# Patient Record
Sex: Male | Born: 1945 | Race: White | Hispanic: No | Marital: Married | Smoking: Never smoker
Health system: Southern US, Community
[De-identification: ages and names within clinical notes are randomized; demographics above are authoritative.]

## PROBLEM LIST (undated history)

## (undated) DIAGNOSIS — R42 Dizziness and giddiness: Secondary | ICD-10-CM

## (undated) DIAGNOSIS — R079 Chest pain, unspecified: Secondary | ICD-10-CM

## (undated) HISTORY — DX: Chest pain, unspecified: R07.9

## (undated) HISTORY — DX: Dizziness and giddiness: R42

---

## 2012-05-25 DIAGNOSIS — H35379 Puckering of macula, unspecified eye: Secondary | ICD-10-CM | POA: Diagnosis not present

## 2012-05-25 DIAGNOSIS — H524 Presbyopia: Secondary | ICD-10-CM | POA: Diagnosis not present

## 2012-05-25 DIAGNOSIS — H2510 Age-related nuclear cataract, unspecified eye: Secondary | ICD-10-CM | POA: Diagnosis not present

## 2013-04-21 ENCOUNTER — Other Ambulatory Visit: Payer: Self-pay | Admitting: Gastroenterology

## 2013-04-21 ENCOUNTER — Ambulatory Visit
Admission: RE | Admit: 2013-04-21 | Discharge: 2013-04-21 | Disposition: A | Discharge status: Home / Self Care | Payer: Medicare PPO | Source: Ambulatory Visit | Attending: Gastroenterology | Admitting: Gastroenterology

## 2013-04-21 DIAGNOSIS — R05 Cough: Secondary | ICD-10-CM

## 2013-04-21 DIAGNOSIS — R059 Cough, unspecified: Secondary | ICD-10-CM

## 2013-07-07 DIAGNOSIS — E785 Hyperlipidemia, unspecified: Secondary | ICD-10-CM | POA: Diagnosis not present

## 2013-07-13 DIAGNOSIS — E785 Hyperlipidemia, unspecified: Secondary | ICD-10-CM | POA: Diagnosis not present

## 2013-08-24 DIAGNOSIS — H43819 Vitreous degeneration, unspecified eye: Secondary | ICD-10-CM | POA: Diagnosis not present

## 2013-08-24 DIAGNOSIS — H2510 Age-related nuclear cataract, unspecified eye: Secondary | ICD-10-CM | POA: Diagnosis not present

## 2013-08-24 DIAGNOSIS — H35379 Puckering of macula, unspecified eye: Secondary | ICD-10-CM | POA: Diagnosis not present

## 2013-08-24 DIAGNOSIS — H25019 Cortical age-related cataract, unspecified eye: Secondary | ICD-10-CM | POA: Diagnosis not present

## 2013-09-29 DIAGNOSIS — H2589 Other age-related cataract: Secondary | ICD-10-CM | POA: Diagnosis not present

## 2013-09-29 DIAGNOSIS — H35379 Puckering of macula, unspecified eye: Secondary | ICD-10-CM | POA: Diagnosis not present

## 2013-09-29 DIAGNOSIS — H2510 Age-related nuclear cataract, unspecified eye: Secondary | ICD-10-CM | POA: Diagnosis not present

## 2013-09-29 DIAGNOSIS — H25019 Cortical age-related cataract, unspecified eye: Secondary | ICD-10-CM | POA: Diagnosis not present

## 2013-10-13 DIAGNOSIS — H2589 Other age-related cataract: Secondary | ICD-10-CM | POA: Diagnosis not present

## 2013-10-13 DIAGNOSIS — H2510 Age-related nuclear cataract, unspecified eye: Secondary | ICD-10-CM | POA: Diagnosis not present

## 2013-10-13 DIAGNOSIS — H25019 Cortical age-related cataract, unspecified eye: Secondary | ICD-10-CM | POA: Diagnosis not present

## 2013-10-14 DIAGNOSIS — L219 Seborrheic dermatitis, unspecified: Secondary | ICD-10-CM | POA: Diagnosis not present

## 2013-10-14 DIAGNOSIS — L919 Hypertrophic disorder of the skin, unspecified: Secondary | ICD-10-CM | POA: Diagnosis not present

## 2013-10-14 DIAGNOSIS — L821 Other seborrheic keratosis: Secondary | ICD-10-CM | POA: Diagnosis not present

## 2013-10-14 DIAGNOSIS — D2320 Other benign neoplasm of skin of unspecified ear and external auricular canal: Secondary | ICD-10-CM | POA: Diagnosis not present

## 2013-10-14 DIAGNOSIS — L738 Other specified follicular disorders: Secondary | ICD-10-CM | POA: Diagnosis not present

## 2013-10-14 DIAGNOSIS — L570 Actinic keratosis: Secondary | ICD-10-CM | POA: Diagnosis not present

## 2013-10-14 DIAGNOSIS — D485 Neoplasm of uncertain behavior of skin: Secondary | ICD-10-CM | POA: Diagnosis not present

## 2013-10-14 DIAGNOSIS — L909 Atrophic disorder of skin, unspecified: Secondary | ICD-10-CM | POA: Diagnosis not present

## 2013-10-14 DIAGNOSIS — D239 Other benign neoplasm of skin, unspecified: Secondary | ICD-10-CM | POA: Diagnosis not present

## 2013-10-14 DIAGNOSIS — C44611 Basal cell carcinoma of skin of unspecified upper limb, including shoulder: Secondary | ICD-10-CM | POA: Diagnosis not present

## 2013-10-29 DIAGNOSIS — C44611 Basal cell carcinoma of skin of unspecified upper limb, including shoulder: Secondary | ICD-10-CM | POA: Diagnosis not present

## 2013-11-03 ENCOUNTER — Encounter: Payer: Self-pay | Admitting: *Deleted

## 2013-11-03 ENCOUNTER — Encounter: Payer: Self-pay | Admitting: Cardiology

## 2014-03-03 DIAGNOSIS — N529 Male erectile dysfunction, unspecified: Secondary | ICD-10-CM | POA: Diagnosis not present

## 2014-03-03 DIAGNOSIS — N393 Stress incontinence (female) (male): Secondary | ICD-10-CM | POA: Diagnosis not present

## 2014-03-03 DIAGNOSIS — C61 Malignant neoplasm of prostate: Secondary | ICD-10-CM | POA: Diagnosis not present

## 2014-03-07 DIAGNOSIS — J3489 Other specified disorders of nose and nasal sinuses: Secondary | ICD-10-CM | POA: Diagnosis not present

## 2014-03-07 DIAGNOSIS — Z23 Encounter for immunization: Secondary | ICD-10-CM | POA: Diagnosis not present

## 2014-03-07 DIAGNOSIS — R030 Elevated blood-pressure reading, without diagnosis of hypertension: Secondary | ICD-10-CM | POA: Diagnosis not present

## 2014-03-07 DIAGNOSIS — J309 Allergic rhinitis, unspecified: Secondary | ICD-10-CM | POA: Diagnosis not present

## 2014-03-22 DIAGNOSIS — N200 Calculus of kidney: Secondary | ICD-10-CM | POA: Diagnosis not present

## 2014-03-22 DIAGNOSIS — N528 Other male erectile dysfunction: Secondary | ICD-10-CM | POA: Diagnosis not present

## 2014-03-22 DIAGNOSIS — C61 Malignant neoplasm of prostate: Secondary | ICD-10-CM | POA: Diagnosis not present

## 2014-03-24 DIAGNOSIS — D2221 Melanocytic nevi of right ear and external auricular canal: Secondary | ICD-10-CM | POA: Diagnosis not present

## 2014-03-24 DIAGNOSIS — L218 Other seborrheic dermatitis: Secondary | ICD-10-CM | POA: Diagnosis not present

## 2014-03-24 DIAGNOSIS — L570 Actinic keratosis: Secondary | ICD-10-CM | POA: Diagnosis not present

## 2014-04-27 DIAGNOSIS — Z8546 Personal history of malignant neoplasm of prostate: Secondary | ICD-10-CM | POA: Diagnosis not present

## 2014-04-27 DIAGNOSIS — Z79899 Other long term (current) drug therapy: Secondary | ICD-10-CM | POA: Diagnosis not present

## 2014-04-27 DIAGNOSIS — G250 Essential tremor: Secondary | ICD-10-CM | POA: Diagnosis not present

## 2014-04-27 DIAGNOSIS — Z0001 Encounter for general adult medical examination with abnormal findings: Secondary | ICD-10-CM | POA: Diagnosis not present

## 2014-04-27 DIAGNOSIS — Z23 Encounter for immunization: Secondary | ICD-10-CM | POA: Diagnosis not present

## 2014-04-27 DIAGNOSIS — E785 Hyperlipidemia, unspecified: Secondary | ICD-10-CM | POA: Diagnosis not present

## 2014-04-27 DIAGNOSIS — J3489 Other specified disorders of nose and nasal sinuses: Secondary | ICD-10-CM | POA: Diagnosis not present

## 2014-04-27 DIAGNOSIS — R7309 Other abnormal glucose: Secondary | ICD-10-CM | POA: Diagnosis not present

## 2014-09-13 DIAGNOSIS — D485 Neoplasm of uncertain behavior of skin: Secondary | ICD-10-CM | POA: Diagnosis not present

## 2014-09-13 DIAGNOSIS — D2272 Melanocytic nevi of left lower limb, including hip: Secondary | ICD-10-CM | POA: Diagnosis not present

## 2014-09-13 DIAGNOSIS — L821 Other seborrheic keratosis: Secondary | ICD-10-CM | POA: Diagnosis not present

## 2014-09-13 DIAGNOSIS — C44519 Basal cell carcinoma of skin of other part of trunk: Secondary | ICD-10-CM | POA: Diagnosis not present

## 2014-09-13 DIAGNOSIS — D2221 Melanocytic nevi of right ear and external auricular canal: Secondary | ICD-10-CM | POA: Diagnosis not present

## 2014-09-13 DIAGNOSIS — Z85828 Personal history of other malignant neoplasm of skin: Secondary | ICD-10-CM | POA: Diagnosis not present

## 2014-09-13 DIAGNOSIS — L814 Other melanin hyperpigmentation: Secondary | ICD-10-CM | POA: Diagnosis not present

## 2014-09-13 DIAGNOSIS — D1801 Hemangioma of skin and subcutaneous tissue: Secondary | ICD-10-CM | POA: Diagnosis not present

## 2014-09-13 DIAGNOSIS — D0461 Carcinoma in situ of skin of right upper limb, including shoulder: Secondary | ICD-10-CM | POA: Diagnosis not present

## 2014-09-13 DIAGNOSIS — L918 Other hypertrophic disorders of the skin: Secondary | ICD-10-CM | POA: Diagnosis not present

## 2014-09-15 DIAGNOSIS — J342 Deviated nasal septum: Secondary | ICD-10-CM | POA: Diagnosis not present

## 2014-09-15 DIAGNOSIS — J331 Polypoid sinus degeneration: Secondary | ICD-10-CM | POA: Diagnosis not present

## 2014-09-15 DIAGNOSIS — J329 Chronic sinusitis, unspecified: Secondary | ICD-10-CM | POA: Diagnosis not present

## 2014-09-15 DIAGNOSIS — J322 Chronic ethmoidal sinusitis: Secondary | ICD-10-CM | POA: Diagnosis not present

## 2014-09-15 DIAGNOSIS — J330 Polyp of nasal cavity: Secondary | ICD-10-CM | POA: Diagnosis not present

## 2014-09-15 DIAGNOSIS — H6122 Impacted cerumen, left ear: Secondary | ICD-10-CM | POA: Diagnosis not present

## 2014-09-28 DIAGNOSIS — E785 Hyperlipidemia, unspecified: Secondary | ICD-10-CM | POA: Diagnosis not present

## 2014-09-28 DIAGNOSIS — Z79899 Other long term (current) drug therapy: Secondary | ICD-10-CM | POA: Diagnosis not present

## 2014-09-28 DIAGNOSIS — R7309 Other abnormal glucose: Secondary | ICD-10-CM | POA: Diagnosis not present

## 2014-10-11 DIAGNOSIS — C44519 Basal cell carcinoma of skin of other part of trunk: Secondary | ICD-10-CM | POA: Diagnosis not present

## 2014-10-11 DIAGNOSIS — D0461 Carcinoma in situ of skin of right upper limb, including shoulder: Secondary | ICD-10-CM | POA: Diagnosis not present

## 2014-10-11 DIAGNOSIS — Z85828 Personal history of other malignant neoplasm of skin: Secondary | ICD-10-CM | POA: Diagnosis not present

## 2014-10-17 DIAGNOSIS — J342 Deviated nasal septum: Secondary | ICD-10-CM | POA: Diagnosis not present

## 2014-10-17 DIAGNOSIS — J322 Chronic ethmoidal sinusitis: Secondary | ICD-10-CM | POA: Diagnosis not present

## 2014-10-20 DIAGNOSIS — A6920 Lyme disease, unspecified: Secondary | ICD-10-CM | POA: Diagnosis not present

## 2014-10-20 DIAGNOSIS — R739 Hyperglycemia, unspecified: Secondary | ICD-10-CM | POA: Diagnosis not present

## 2015-03-14 DIAGNOSIS — N5231 Erectile dysfunction following radical prostatectomy: Secondary | ICD-10-CM | POA: Diagnosis not present

## 2015-03-14 DIAGNOSIS — R32 Unspecified urinary incontinence: Secondary | ICD-10-CM | POA: Diagnosis not present

## 2015-03-14 DIAGNOSIS — C61 Malignant neoplasm of prostate: Secondary | ICD-10-CM | POA: Diagnosis not present

## 2015-03-14 DIAGNOSIS — Z9079 Acquired absence of other genital organ(s): Secondary | ICD-10-CM | POA: Diagnosis not present

## 2015-03-16 DIAGNOSIS — Z85828 Personal history of other malignant neoplasm of skin: Secondary | ICD-10-CM | POA: Diagnosis not present

## 2015-03-16 DIAGNOSIS — D2272 Melanocytic nevi of left lower limb, including hip: Secondary | ICD-10-CM | POA: Diagnosis not present

## 2015-03-16 DIAGNOSIS — L570 Actinic keratosis: Secondary | ICD-10-CM | POA: Diagnosis not present

## 2015-03-16 DIAGNOSIS — L821 Other seborrheic keratosis: Secondary | ICD-10-CM | POA: Diagnosis not present

## 2015-03-16 DIAGNOSIS — L218 Other seborrheic dermatitis: Secondary | ICD-10-CM | POA: Diagnosis not present

## 2015-03-16 DIAGNOSIS — L814 Other melanin hyperpigmentation: Secondary | ICD-10-CM | POA: Diagnosis not present

## 2015-03-16 DIAGNOSIS — L853 Xerosis cutis: Secondary | ICD-10-CM | POA: Diagnosis not present

## 2015-04-19 DIAGNOSIS — Z23 Encounter for immunization: Secondary | ICD-10-CM | POA: Diagnosis not present

## 2015-05-01 DIAGNOSIS — Z0000 Encounter for general adult medical examination without abnormal findings: Secondary | ICD-10-CM | POA: Diagnosis not present

## 2015-05-01 DIAGNOSIS — E785 Hyperlipidemia, unspecified: Secondary | ICD-10-CM | POA: Diagnosis not present

## 2015-05-01 DIAGNOSIS — Z79899 Other long term (current) drug therapy: Secondary | ICD-10-CM | POA: Diagnosis not present

## 2015-06-06 DIAGNOSIS — J069 Acute upper respiratory infection, unspecified: Secondary | ICD-10-CM | POA: Diagnosis not present

## 2015-07-13 DIAGNOSIS — C44321 Squamous cell carcinoma of skin of nose: Secondary | ICD-10-CM | POA: Diagnosis not present

## 2015-07-13 DIAGNOSIS — D485 Neoplasm of uncertain behavior of skin: Secondary | ICD-10-CM | POA: Diagnosis not present

## 2015-08-04 DIAGNOSIS — C44321 Squamous cell carcinoma of skin of nose: Secondary | ICD-10-CM | POA: Diagnosis not present

## 2015-09-12 DIAGNOSIS — D2239 Melanocytic nevi of other parts of face: Secondary | ICD-10-CM | POA: Diagnosis not present

## 2015-09-12 DIAGNOSIS — D2272 Melanocytic nevi of left lower limb, including hip: Secondary | ICD-10-CM | POA: Diagnosis not present

## 2015-09-12 DIAGNOSIS — D1801 Hemangioma of skin and subcutaneous tissue: Secondary | ICD-10-CM | POA: Diagnosis not present

## 2015-09-12 DIAGNOSIS — L570 Actinic keratosis: Secondary | ICD-10-CM | POA: Diagnosis not present

## 2015-09-12 DIAGNOSIS — L218 Other seborrheic dermatitis: Secondary | ICD-10-CM | POA: Diagnosis not present

## 2015-09-12 DIAGNOSIS — L821 Other seborrheic keratosis: Secondary | ICD-10-CM | POA: Diagnosis not present

## 2015-09-12 DIAGNOSIS — Z85828 Personal history of other malignant neoplasm of skin: Secondary | ICD-10-CM | POA: Diagnosis not present

## 2015-09-12 DIAGNOSIS — D692 Other nonthrombocytopenic purpura: Secondary | ICD-10-CM | POA: Diagnosis not present

## 2015-09-12 DIAGNOSIS — L814 Other melanin hyperpigmentation: Secondary | ICD-10-CM | POA: Diagnosis not present

## 2016-02-21 DIAGNOSIS — E782 Mixed hyperlipidemia: Secondary | ICD-10-CM | POA: Diagnosis not present

## 2016-02-21 DIAGNOSIS — Z86010 Personal history of colonic polyps: Secondary | ICD-10-CM | POA: Diagnosis not present

## 2016-02-21 DIAGNOSIS — C61 Malignant neoplasm of prostate: Secondary | ICD-10-CM | POA: Diagnosis not present

## 2016-03-05 DIAGNOSIS — Z23 Encounter for immunization: Secondary | ICD-10-CM | POA: Diagnosis not present

## 2016-03-19 DIAGNOSIS — L720 Epidermal cyst: Secondary | ICD-10-CM | POA: Diagnosis not present

## 2016-03-19 DIAGNOSIS — D1801 Hemangioma of skin and subcutaneous tissue: Secondary | ICD-10-CM | POA: Diagnosis not present

## 2016-03-19 DIAGNOSIS — Z85828 Personal history of other malignant neoplasm of skin: Secondary | ICD-10-CM | POA: Diagnosis not present

## 2016-03-19 DIAGNOSIS — L821 Other seborrheic keratosis: Secondary | ICD-10-CM | POA: Diagnosis not present

## 2016-03-19 DIAGNOSIS — L570 Actinic keratosis: Secondary | ICD-10-CM | POA: Diagnosis not present

## 2016-03-19 DIAGNOSIS — L814 Other melanin hyperpigmentation: Secondary | ICD-10-CM | POA: Diagnosis not present

## 2016-03-19 DIAGNOSIS — D485 Neoplasm of uncertain behavior of skin: Secondary | ICD-10-CM | POA: Diagnosis not present

## 2016-03-28 DIAGNOSIS — K5730 Diverticulosis of large intestine without perforation or abscess without bleeding: Secondary | ICD-10-CM | POA: Diagnosis not present

## 2016-03-28 DIAGNOSIS — Z86010 Personal history of colonic polyps: Secondary | ICD-10-CM | POA: Diagnosis not present

## 2016-04-12 DIAGNOSIS — Z125 Encounter for screening for malignant neoplasm of prostate: Secondary | ICD-10-CM | POA: Diagnosis not present

## 2016-04-12 DIAGNOSIS — M545 Low back pain: Secondary | ICD-10-CM | POA: Diagnosis not present

## 2016-04-12 DIAGNOSIS — E7800 Pure hypercholesterolemia, unspecified: Secondary | ICD-10-CM | POA: Diagnosis not present

## 2016-04-12 DIAGNOSIS — Z79899 Other long term (current) drug therapy: Secondary | ICD-10-CM | POA: Diagnosis not present

## 2016-04-12 DIAGNOSIS — R7309 Other abnormal glucose: Secondary | ICD-10-CM | POA: Diagnosis not present

## 2016-05-17 DIAGNOSIS — M545 Low back pain: Secondary | ICD-10-CM | POA: Diagnosis not present

## 2016-05-20 DIAGNOSIS — M545 Low back pain: Secondary | ICD-10-CM | POA: Diagnosis not present

## 2016-05-22 DIAGNOSIS — M545 Low back pain: Secondary | ICD-10-CM | POA: Diagnosis not present

## 2016-05-24 DIAGNOSIS — M545 Low back pain: Secondary | ICD-10-CM | POA: Diagnosis not present

## 2016-05-27 DIAGNOSIS — M545 Low back pain: Secondary | ICD-10-CM | POA: Diagnosis not present

## 2016-05-31 DIAGNOSIS — M545 Low back pain: Secondary | ICD-10-CM | POA: Diagnosis not present

## 2016-08-12 DIAGNOSIS — S61411A Laceration without foreign body of right hand, initial encounter: Secondary | ICD-10-CM | POA: Diagnosis not present

## 2016-08-26 ENCOUNTER — Other Ambulatory Visit: Payer: Self-pay | Admitting: Family Medicine

## 2016-08-26 DIAGNOSIS — E7800 Pure hypercholesterolemia, unspecified: Secondary | ICD-10-CM | POA: Diagnosis not present

## 2016-08-26 DIAGNOSIS — R0602 Shortness of breath: Secondary | ICD-10-CM | POA: Diagnosis not present

## 2016-08-26 DIAGNOSIS — R42 Dizziness and giddiness: Secondary | ICD-10-CM

## 2016-08-27 ENCOUNTER — Telehealth: Payer: Self-pay | Admitting: *Deleted

## 2016-08-27 DIAGNOSIS — K219 Gastro-esophageal reflux disease without esophagitis: Secondary | ICD-10-CM | POA: Diagnosis not present

## 2016-08-27 DIAGNOSIS — K210 Gastro-esophageal reflux disease with esophagitis: Secondary | ICD-10-CM | POA: Diagnosis not present

## 2016-08-27 DIAGNOSIS — R12 Heartburn: Secondary | ICD-10-CM | POA: Diagnosis not present

## 2016-08-27 DIAGNOSIS — K208 Other esophagitis: Secondary | ICD-10-CM | POA: Diagnosis not present

## 2016-08-27 DIAGNOSIS — K3189 Other diseases of stomach and duodenum: Secondary | ICD-10-CM | POA: Diagnosis not present

## 2016-08-27 DIAGNOSIS — K319 Disease of stomach and duodenum, unspecified: Secondary | ICD-10-CM | POA: Diagnosis not present

## 2016-08-27 DIAGNOSIS — K449 Diaphragmatic hernia without obstruction or gangrene: Secondary | ICD-10-CM | POA: Diagnosis not present

## 2016-08-27 DIAGNOSIS — K2270 Barrett's esophagus without dysplasia: Secondary | ICD-10-CM | POA: Diagnosis not present

## 2016-08-27 NOTE — Telephone Encounter (Signed)
NOTES SENT TO SCHEDULING.  °

## 2016-09-02 ENCOUNTER — Encounter: Payer: Self-pay | Admitting: Cardiology

## 2016-09-03 ENCOUNTER — Encounter: Payer: Self-pay | Admitting: Cardiology

## 2016-09-03 ENCOUNTER — Ambulatory Visit
Admission: RE | Admit: 2016-09-03 | Discharge: 2016-09-03 | Disposition: A | Discharge status: Home / Self Care | Payer: Medicare Other | Source: Ambulatory Visit | Attending: Family Medicine | Admitting: Family Medicine

## 2016-09-03 ENCOUNTER — Ambulatory Visit (INDEPENDENT_AMBULATORY_CARE_PROVIDER_SITE_OTHER): Payer: Medicare Other | Admitting: Cardiology

## 2016-09-03 VITALS — BP 142/82 | HR 80 | Ht 72.0 in | Wt 209.0 lb

## 2016-09-03 DIAGNOSIS — R0600 Dyspnea, unspecified: Secondary | ICD-10-CM

## 2016-09-03 DIAGNOSIS — E7800 Pure hypercholesterolemia, unspecified: Secondary | ICD-10-CM | POA: Diagnosis not present

## 2016-09-03 DIAGNOSIS — R0789 Other chest pain: Secondary | ICD-10-CM | POA: Diagnosis not present

## 2016-09-03 DIAGNOSIS — R0609 Other forms of dyspnea: Secondary | ICD-10-CM

## 2016-09-03 DIAGNOSIS — I6523 Occlusion and stenosis of bilateral carotid arteries: Secondary | ICD-10-CM | POA: Diagnosis not present

## 2016-09-03 DIAGNOSIS — R42 Dizziness and giddiness: Secondary | ICD-10-CM

## 2016-09-03 NOTE — Progress Notes (Signed)
Cardiology Office Note:    Date:  09/03/2016   ID:  Cody Haas, DOB 03/29/1946, MRN [id number]  PCP:  Stephani Police, MD, MD    Referring MD: Lujean Amel, MD     History of Present Illness:    Cody Haas is a 71 y.o. male here for evaluation of shortness of breath at the request of Dr. Dorthy Cooler. He's been noticing mild chest pressure, lightheadedness and mild shortness of breath more than usual. Relieved with rest. Occasional dizziness as well. Had a stress test 5 years ago which was negative (ETT and NUC). Has hyperlipidemia, currently restarting simvastatin.   Today he is feeling well. He is concerned because his mother had stroke around his age.  We discussed screening test today.  He spends half of his time in [place] and the other half of this time in the [place].  Prior CV studies:   The following studies were reviewed today:  EKG on 08/26/16-sinus rhythm 68 with no other abnormalities. T-wave flattening noted in V2.  Right 409, Left 811 systolic blood pressure at the carotid Doppler today.  Past Medical History:  Diagnosis Date  . Chest pain   . Dizziness     No past surgical history on file.  Current Medications: Current Meds  Medication Sig  . Glucosamine HCl (GLUCOSAMINE PO) Take 1 tablet by mouth daily.  . Multiple Vitamin (MULTIVITAMIN) capsule Take 1 capsule by mouth daily.  . Omega-3 Fatty Acids (FISH OIL PO) Take 1 tablet by mouth daily.  Marland Kitchen omeprazole (PRILOSEC) 40 MG capsule Take 1 capsule by mouth every morning.     Allergies:   Penicillin g   Social History   Social History  . Marital status: Married    Spouse name: N/A  . Number of children: N/A  . Years of education: N/A   Social History Main Topics  . Smoking status: Never Smoker  . Smokeless tobacco: Never Used  . Alcohol use No  . Drug use: No  . Sexual activity: Not Asked   Other Topics Concern  . None   Social History Narrative  . None     Family history: Mother  had stroke.   ROS:   Please see the history of present illness.   Positive for shortness of breath with activity, easy bruising ROS All other systems reviewed and are negative.   EKGs/Labs/Other Test Reviewed:    EKG:  EKG personally reviewed above, sinus rhythm 68 no other abnormalities.  Recent Labs: No results found for requested labs within last 8760 hours.   Recent Lipid Panel No results found for: CHOL, TRIG, HDL, CHOLHDL, VLDL, LDLCALC, LDLDIRECT   Physical Exam:    VS:  BP (!) 142/82 (BP Location: Left Arm)   Pulse 80   Ht 6' (1.829 m)   Wt 209 lb (94.8 kg)   BMI 28.35 kg/m     Wt Readings from Last 3 Encounters:  09/03/16 209 lb (94.8 kg)     GEN: Well nourished, well developed, in no acute distress  HEENT: normal  Neck: no JVD, carotid bruits, or masses Cardiac: RRR; no murmurs, rubs, or gallops,no edema  Respiratory:  clear to auscultation bilaterally, normal work of breathing GI: soft, nontender, nondistended, + BS MS: no deformity or atrophy  Skin: warm and dry, no rash Neuro:  Alert and Oriented x 3, Strength and sensation are intact Psych: euthymic mood, full affect   ASSESSMENT:    1. Chest pressure   2. Dyspnea on  exertion   3. Pure hypercholesterolemia    PLAN:    In order of problems listed above:  Atypical chest pain/shortness of breath  - We will order an exercise treadmill test. He has had a nuclear stress test in the past which was low risk.  - Fairly atypical symptoms and may be secondary to deconditioning however given his hyperlipidemia, age we'll proceed with stress test.  Hyperlipidemia  - Agree with statin. Continue to monitor per Dr. Dorthy Cooler  - Prevention.  - Diet, exercise.  Family history of stroke  - Awaiting results of carotid Dopplers.  - Plaque is present, statin.  - Blood pressure today in right arm was 130/82, left arm 142/82.  Dispo:  No Follow-up on file.   Medication Adjustments/Labs and Tests  Ordered: Current medicines are reviewed at length with the patient today.  Concerns regarding medicines are outlined above.  Orders placed this visit:  Orders Placed This Encounter  Procedures  . Exercise Tolerance Test   Medication changes this visit: No orders of the defined types were placed in this encounter.   Signed, Candee Furbish, MD  09/03/2016 3:13 PM    [facility] [place], [place], [place]  [address] Phone: [phone]; Fax: [phone]

## 2016-09-03 NOTE — Patient Instructions (Signed)
Medication Instructions:  The current medical regimen is effective;  continue present plan and medications.  Testing/Procedures: Your physician has requested that you have an exercise tolerance test. For further information please visit [email or web address]. Please also follow instruction sheet, as given.  Follow-Up: Follow up as needed with Dr Marlou Porch after testing.  Thank you for choosing [facility]!!

## 2016-09-06 ENCOUNTER — Ambulatory Visit (INDEPENDENT_AMBULATORY_CARE_PROVIDER_SITE_OTHER): Payer: Medicare Other

## 2016-09-06 DIAGNOSIS — R0789 Other chest pain: Secondary | ICD-10-CM | POA: Diagnosis not present

## 2016-09-06 LAB — EXERCISE TOLERANCE TEST
Estimated workload: 10.1 METS
Exercise duration (min): 8 min
Exercise duration (sec): 0 s
MPHR: 149 {beats}/min
Peak HR: 131 {beats}/min
Percent HR: 87 %
RPE: 17
Rest HR: 74 {beats}/min

## 2016-09-17 DIAGNOSIS — L218 Other seborrheic dermatitis: Secondary | ICD-10-CM | POA: Diagnosis not present

## 2016-09-17 DIAGNOSIS — L814 Other melanin hyperpigmentation: Secondary | ICD-10-CM | POA: Diagnosis not present

## 2016-09-17 DIAGNOSIS — D224 Melanocytic nevi of scalp and neck: Secondary | ICD-10-CM | POA: Diagnosis not present

## 2016-09-17 DIAGNOSIS — L720 Epidermal cyst: Secondary | ICD-10-CM | POA: Diagnosis not present

## 2016-09-17 DIAGNOSIS — D1801 Hemangioma of skin and subcutaneous tissue: Secondary | ICD-10-CM | POA: Diagnosis not present

## 2016-09-17 DIAGNOSIS — L918 Other hypertrophic disorders of the skin: Secondary | ICD-10-CM | POA: Diagnosis not present

## 2016-09-17 DIAGNOSIS — D2272 Melanocytic nevi of left lower limb, including hip: Secondary | ICD-10-CM | POA: Diagnosis not present

## 2016-09-17 DIAGNOSIS — Z85828 Personal history of other malignant neoplasm of skin: Secondary | ICD-10-CM | POA: Diagnosis not present

## 2016-09-17 DIAGNOSIS — L570 Actinic keratosis: Secondary | ICD-10-CM | POA: Diagnosis not present

## 2016-09-17 DIAGNOSIS — L813 Cafe au lait spots: Secondary | ICD-10-CM | POA: Diagnosis not present

## 2016-09-17 DIAGNOSIS — L821 Other seborrheic keratosis: Secondary | ICD-10-CM | POA: Diagnosis not present

## 2016-10-25 DIAGNOSIS — R7303 Prediabetes: Secondary | ICD-10-CM | POA: Diagnosis not present

## 2016-10-25 DIAGNOSIS — Z1159 Encounter for screening for other viral diseases: Secondary | ICD-10-CM | POA: Diagnosis not present

## 2016-10-25 DIAGNOSIS — E7800 Pure hypercholesterolemia, unspecified: Secondary | ICD-10-CM | POA: Diagnosis not present

## 2017-02-01 DIAGNOSIS — M270 Developmental disorders of jaws: Secondary | ICD-10-CM | POA: Diagnosis not present

## 2017-03-07 DIAGNOSIS — G250 Essential tremor: Secondary | ICD-10-CM | POA: Diagnosis not present

## 2017-03-07 DIAGNOSIS — Z79899 Other long term (current) drug therapy: Secondary | ICD-10-CM | POA: Diagnosis not present

## 2017-03-07 DIAGNOSIS — Z23 Encounter for immunization: Secondary | ICD-10-CM | POA: Diagnosis not present

## 2017-03-07 DIAGNOSIS — Z0001 Encounter for general adult medical examination with abnormal findings: Secondary | ICD-10-CM | POA: Diagnosis not present

## 2017-03-07 DIAGNOSIS — E7800 Pure hypercholesterolemia, unspecified: Secondary | ICD-10-CM | POA: Diagnosis not present

## 2017-03-07 DIAGNOSIS — Z125 Encounter for screening for malignant neoplasm of prostate: Secondary | ICD-10-CM | POA: Diagnosis not present

## 2017-03-07 DIAGNOSIS — R7303 Prediabetes: Secondary | ICD-10-CM | POA: Diagnosis not present

## 2017-03-07 DIAGNOSIS — Z8546 Personal history of malignant neoplasm of prostate: Secondary | ICD-10-CM | POA: Diagnosis not present

## 2017-03-26 DIAGNOSIS — Z85828 Personal history of other malignant neoplasm of skin: Secondary | ICD-10-CM | POA: Diagnosis not present

## 2017-03-26 DIAGNOSIS — D1801 Hemangioma of skin and subcutaneous tissue: Secondary | ICD-10-CM | POA: Diagnosis not present

## 2017-03-26 DIAGNOSIS — L821 Other seborrheic keratosis: Secondary | ICD-10-CM | POA: Diagnosis not present

## 2017-03-26 DIAGNOSIS — L814 Other melanin hyperpigmentation: Secondary | ICD-10-CM | POA: Diagnosis not present

## 2017-03-26 DIAGNOSIS — D485 Neoplasm of uncertain behavior of skin: Secondary | ICD-10-CM | POA: Diagnosis not present

## 2017-03-26 DIAGNOSIS — C44311 Basal cell carcinoma of skin of nose: Secondary | ICD-10-CM | POA: Diagnosis not present

## 2017-03-26 DIAGNOSIS — D224 Melanocytic nevi of scalp and neck: Secondary | ICD-10-CM | POA: Diagnosis not present

## 2017-03-26 DIAGNOSIS — L570 Actinic keratosis: Secondary | ICD-10-CM | POA: Diagnosis not present

## 2017-04-01 DIAGNOSIS — L821 Other seborrheic keratosis: Secondary | ICD-10-CM | POA: Diagnosis not present

## 2017-04-01 DIAGNOSIS — Z85828 Personal history of other malignant neoplasm of skin: Secondary | ICD-10-CM | POA: Diagnosis not present

## 2017-04-23 DIAGNOSIS — Z85828 Personal history of other malignant neoplasm of skin: Secondary | ICD-10-CM | POA: Diagnosis not present

## 2017-04-23 DIAGNOSIS — C44311 Basal cell carcinoma of skin of nose: Secondary | ICD-10-CM | POA: Diagnosis not present

## 2017-04-29 DIAGNOSIS — L570 Actinic keratosis: Secondary | ICD-10-CM | POA: Diagnosis not present

## 2017-06-09 IMAGING — US US CAROTID DUPLEX BILAT
1 series · 14 of 24 positions shown · non-contrast
Comparison: None.

CLINICAL DATA: Lightheadedness

EXAM:
BILATERAL CAROTID DUPLEX ULTRASOUND
TECHNIQUE: Gray scale imaging, color Doppler and duplex ultrasound were
performed of bilateral carotid and vertebral arteries in the neck.

[Series 1: us carotid duplex bilat · 0.05mm/px · 14 of 54 slices shown]
[im 1/54]
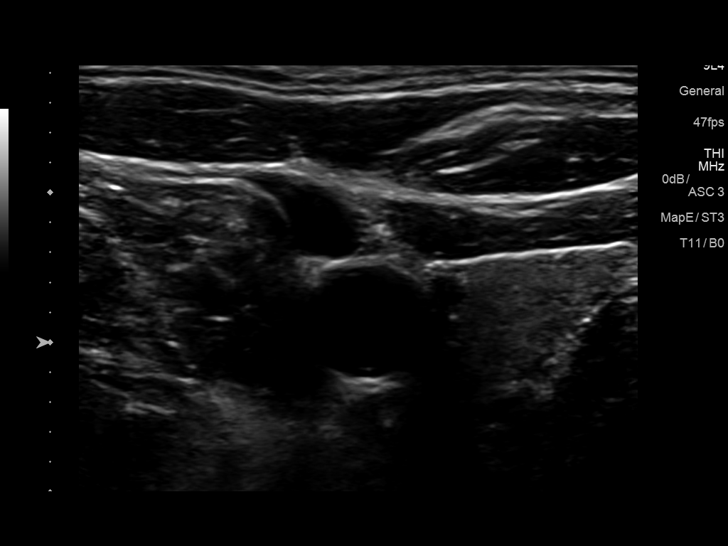
[im 5/54]
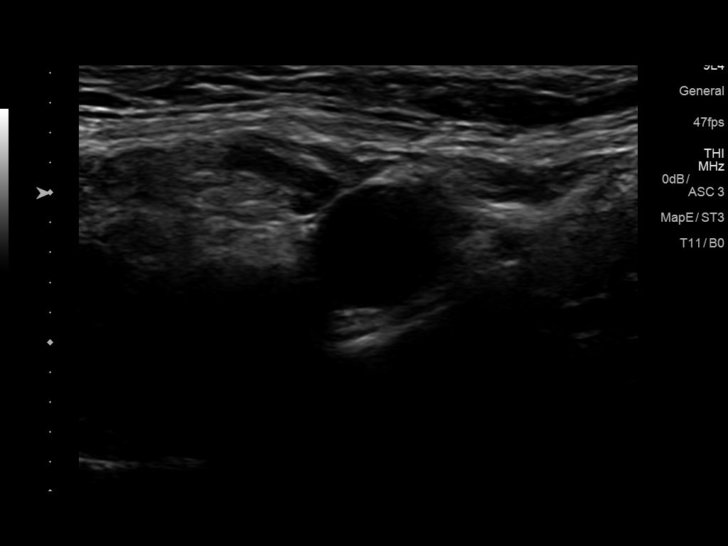
[im 10/54]
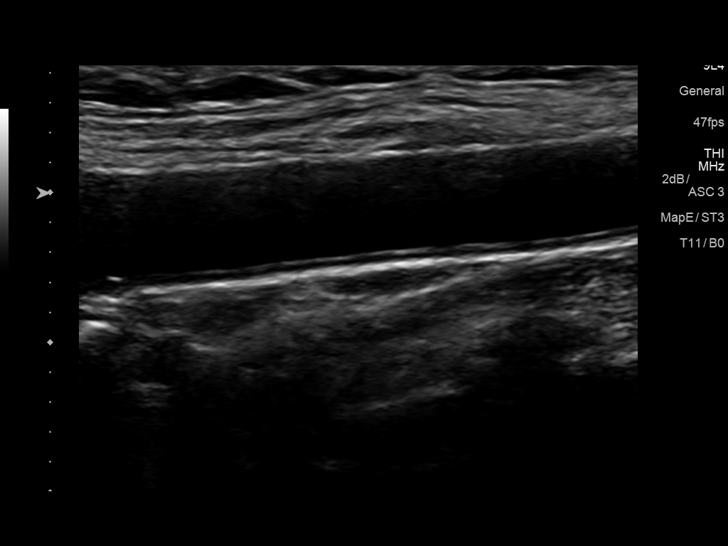
[im 14/54]
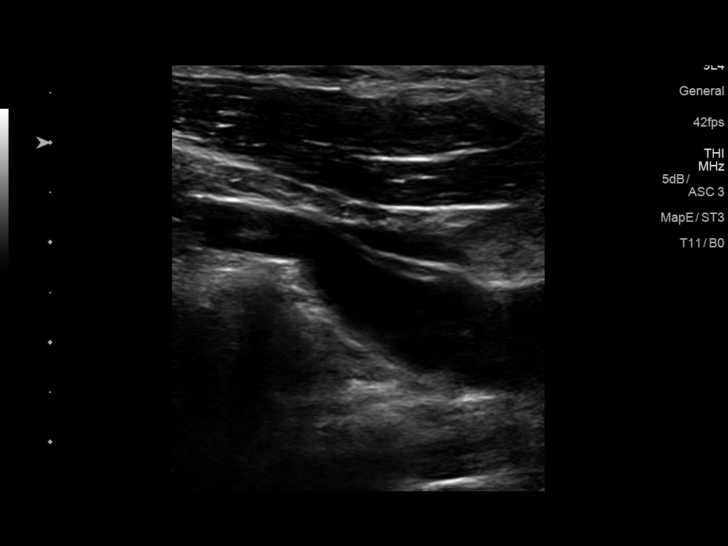
[im 17/54]
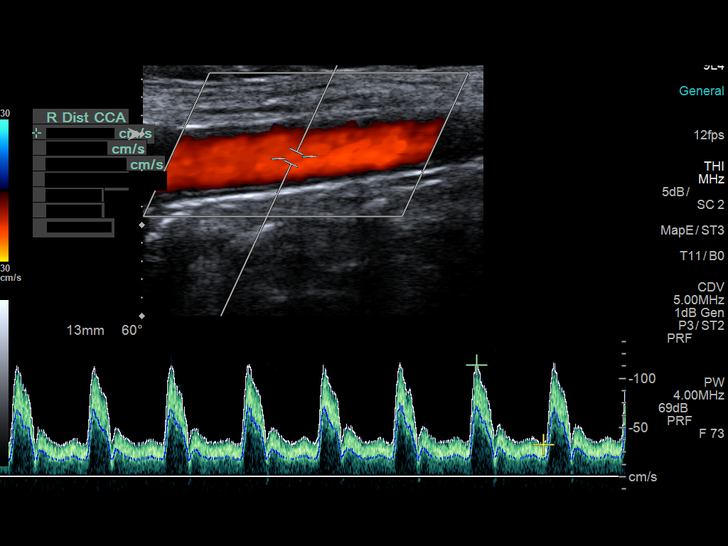
[im 21/54]
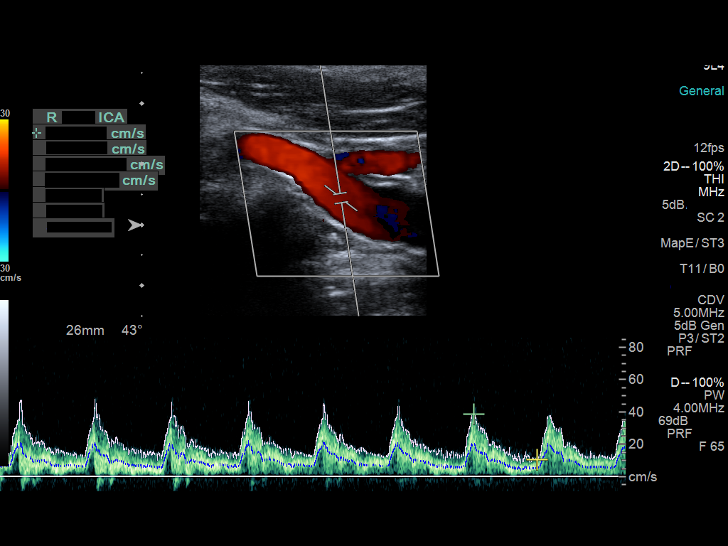
[im 26/54]
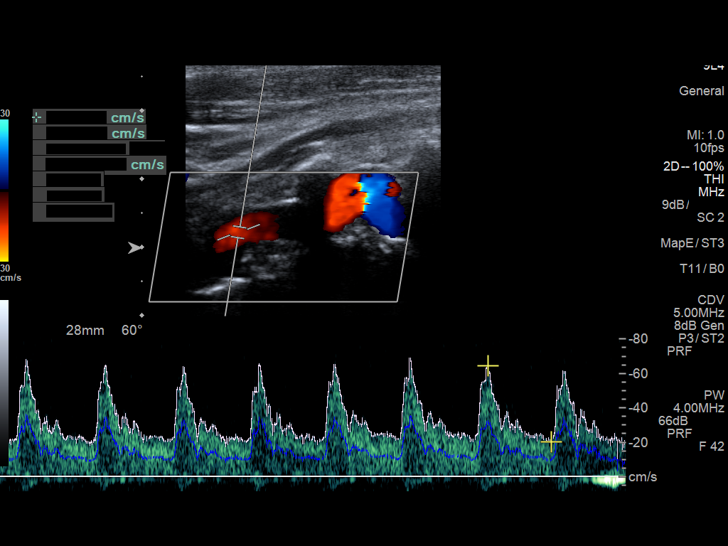
[im 28/54]
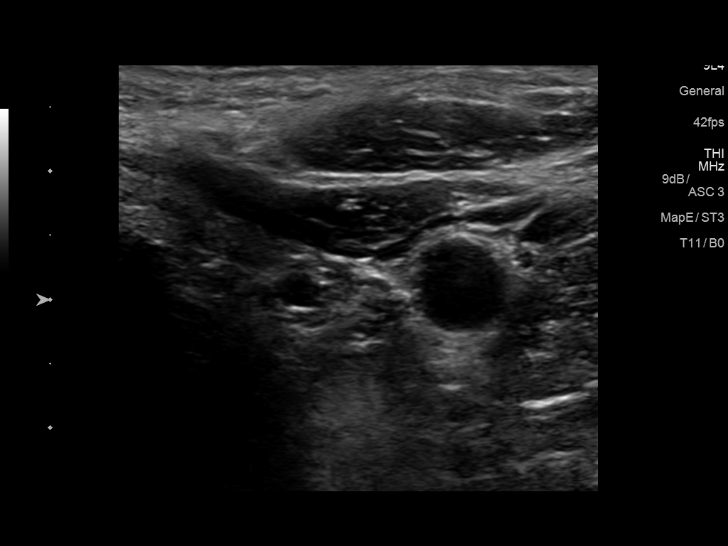
[im 33/54]
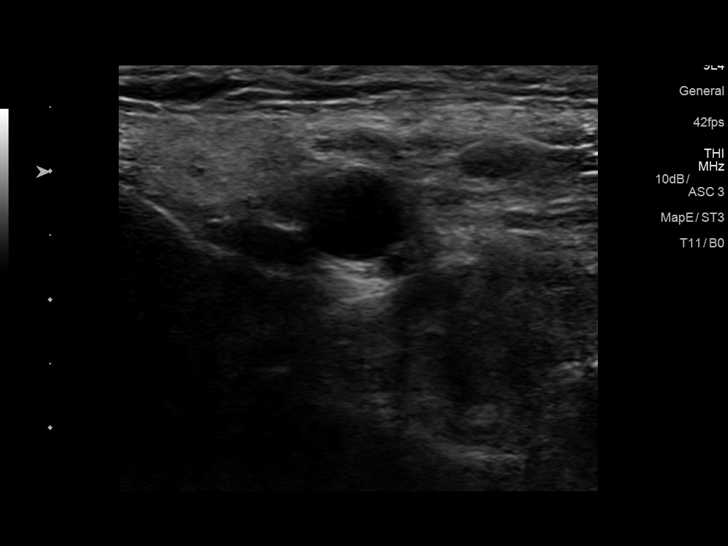
[im 37/54]
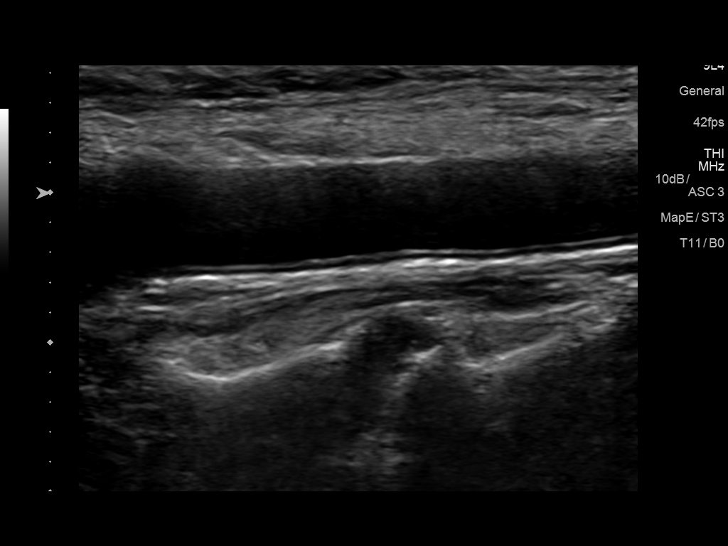
[im 42/54]
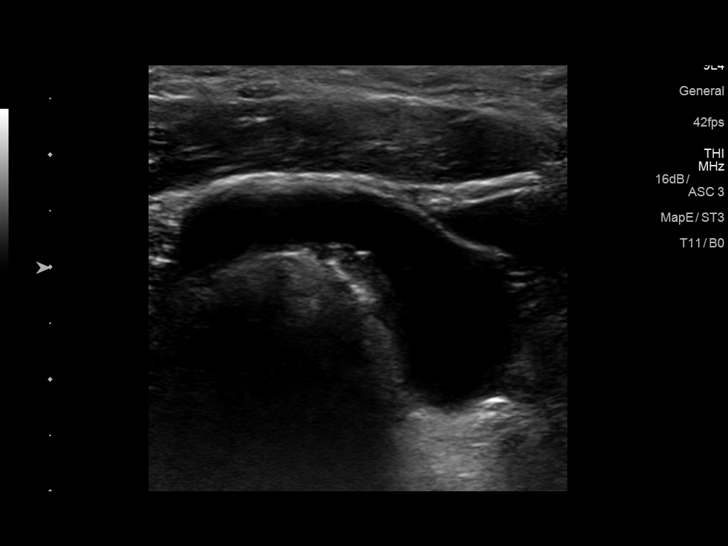
[im 44/54]
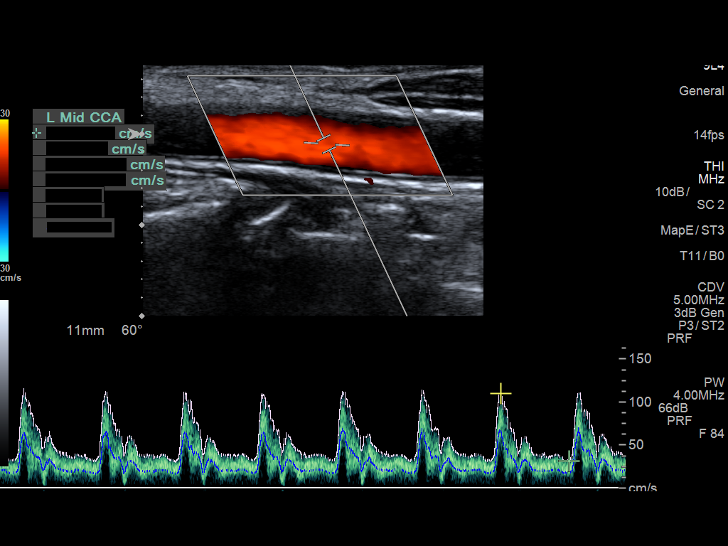
[im 49/54]
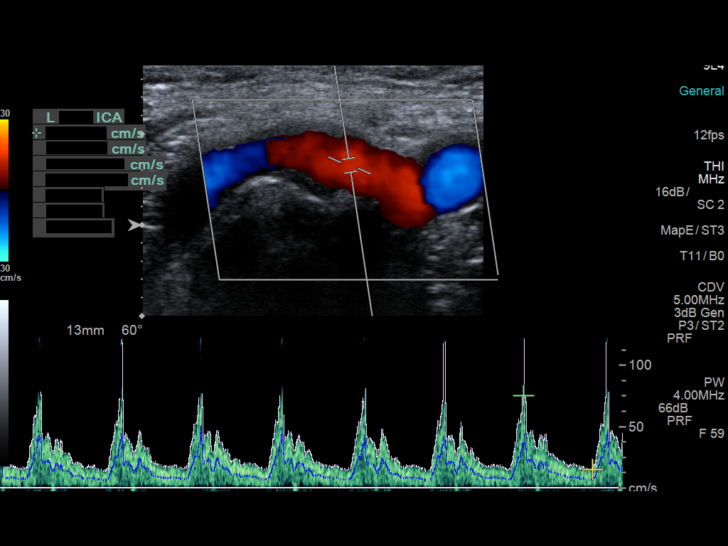
[im 54/54]
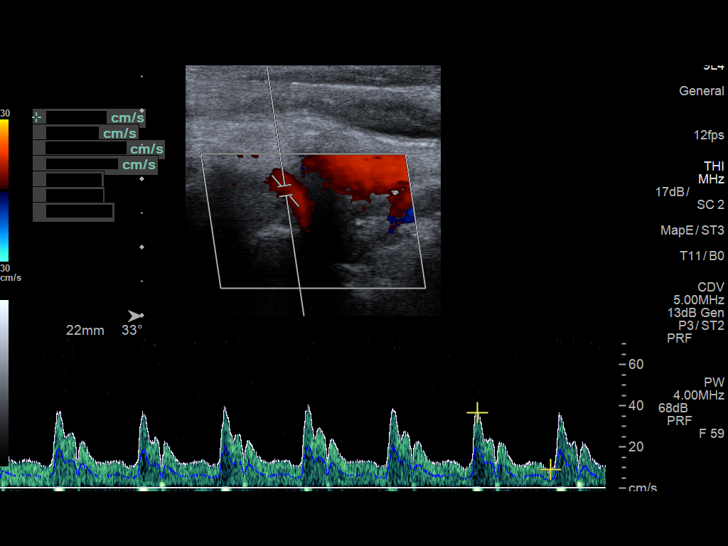

[14 of 24 positions shown; findings below may reference images not displayed]

FINDINGS: Criteria: Quantification of carotid stenosis is based on velocity
parameters that correlate the residual internal carotid diameter
with NASCET-based stenosis levels, using the diameter of the distal
internal carotid lumen as the denominator for stenosis measurement.

The following velocity measurements were obtained:

RIGHT

ICA:  72 cm/sec

CCA:  115 cm/sec

SYSTOLIC ICA/CCA RATIO:

DIASTOLIC ICA/CCA RATIO:

ECA:  126 cm/sec

LEFT

ICA:  75 cm/sec

CCA:  121 cm/sec

SYSTOLIC ICA/CCA RATIO:

DIASTOLIC ICA/CCA RATIO:

ECA:  137 cm/sec

RIGHT CAROTID ARTERY: Little if any plaque in the bulb. Low
resistance internal carotid Doppler pattern.

RIGHT VERTEBRAL ARTERY:  Antegrade.

LEFT CAROTID ARTERY: Mild soft plaque in the bulb. Low resistance
internal carotid Doppler pattern.

LEFT VERTEBRAL ARTERY:  Antegrade.
IMPRESSION: Less than 50% stenosis in the right and left internal carotid
arteries.

## 2017-09-24 DIAGNOSIS — L814 Other melanin hyperpigmentation: Secondary | ICD-10-CM | POA: Diagnosis not present

## 2017-09-24 DIAGNOSIS — L918 Other hypertrophic disorders of the skin: Secondary | ICD-10-CM | POA: Diagnosis not present

## 2017-09-24 DIAGNOSIS — L720 Epidermal cyst: Secondary | ICD-10-CM | POA: Diagnosis not present

## 2017-09-24 DIAGNOSIS — D2239 Melanocytic nevi of other parts of face: Secondary | ICD-10-CM | POA: Diagnosis not present

## 2017-09-24 DIAGNOSIS — L218 Other seborrheic dermatitis: Secondary | ICD-10-CM | POA: Diagnosis not present

## 2017-09-24 DIAGNOSIS — D1801 Hemangioma of skin and subcutaneous tissue: Secondary | ICD-10-CM | POA: Diagnosis not present

## 2017-09-24 DIAGNOSIS — L821 Other seborrheic keratosis: Secondary | ICD-10-CM | POA: Diagnosis not present

## 2017-09-24 DIAGNOSIS — L570 Actinic keratosis: Secondary | ICD-10-CM | POA: Diagnosis not present

## 2017-09-24 DIAGNOSIS — D2272 Melanocytic nevi of left lower limb, including hip: Secondary | ICD-10-CM | POA: Diagnosis not present

## 2017-09-24 DIAGNOSIS — Z85828 Personal history of other malignant neoplasm of skin: Secondary | ICD-10-CM | POA: Diagnosis not present

## 2018-01-28 DIAGNOSIS — L218 Other seborrheic dermatitis: Secondary | ICD-10-CM | POA: Diagnosis not present

## 2018-01-28 DIAGNOSIS — Z85828 Personal history of other malignant neoplasm of skin: Secondary | ICD-10-CM | POA: Diagnosis not present

## 2018-01-28 DIAGNOSIS — L820 Inflamed seborrheic keratosis: Secondary | ICD-10-CM | POA: Diagnosis not present

## 2018-01-28 DIAGNOSIS — L821 Other seborrheic keratosis: Secondary | ICD-10-CM | POA: Diagnosis not present

## 2018-03-09 DIAGNOSIS — K2270 Barrett's esophagus without dysplasia: Secondary | ICD-10-CM | POA: Diagnosis not present

## 2018-03-09 DIAGNOSIS — K219 Gastro-esophageal reflux disease without esophagitis: Secondary | ICD-10-CM | POA: Diagnosis not present

## 2018-03-09 DIAGNOSIS — K208 Other esophagitis: Secondary | ICD-10-CM | POA: Diagnosis not present

## 2018-03-19 DIAGNOSIS — Z125 Encounter for screening for malignant neoplasm of prostate: Secondary | ICD-10-CM | POA: Diagnosis not present

## 2018-03-19 DIAGNOSIS — Z23 Encounter for immunization: Secondary | ICD-10-CM | POA: Diagnosis not present

## 2018-03-19 DIAGNOSIS — R7301 Impaired fasting glucose: Secondary | ICD-10-CM | POA: Diagnosis not present

## 2018-03-19 DIAGNOSIS — E7800 Pure hypercholesterolemia, unspecified: Secondary | ICD-10-CM | POA: Diagnosis not present

## 2018-03-19 DIAGNOSIS — Z79899 Other long term (current) drug therapy: Secondary | ICD-10-CM | POA: Diagnosis not present

## 2018-03-19 DIAGNOSIS — Z0000 Encounter for general adult medical examination without abnormal findings: Secondary | ICD-10-CM | POA: Diagnosis not present

## 2018-03-19 DIAGNOSIS — R5383 Other fatigue: Secondary | ICD-10-CM | POA: Diagnosis not present

## 2018-03-19 DIAGNOSIS — Z8546 Personal history of malignant neoplasm of prostate: Secondary | ICD-10-CM | POA: Diagnosis not present

## 2018-04-01 DIAGNOSIS — Z85828 Personal history of other malignant neoplasm of skin: Secondary | ICD-10-CM | POA: Diagnosis not present

## 2018-04-01 DIAGNOSIS — L821 Other seborrheic keratosis: Secondary | ICD-10-CM | POA: Diagnosis not present

## 2018-04-01 DIAGNOSIS — L820 Inflamed seborrheic keratosis: Secondary | ICD-10-CM | POA: Diagnosis not present

## 2018-04-01 DIAGNOSIS — L570 Actinic keratosis: Secondary | ICD-10-CM | POA: Diagnosis not present

## 2018-04-01 DIAGNOSIS — L814 Other melanin hyperpigmentation: Secondary | ICD-10-CM | POA: Diagnosis not present

## 2018-04-01 DIAGNOSIS — D1801 Hemangioma of skin and subcutaneous tissue: Secondary | ICD-10-CM | POA: Diagnosis not present

## 2018-04-01 DIAGNOSIS — L853 Xerosis cutis: Secondary | ICD-10-CM | POA: Diagnosis not present

## 2018-04-01 DIAGNOSIS — L218 Other seborrheic dermatitis: Secondary | ICD-10-CM | POA: Diagnosis not present

## 2018-10-14 DIAGNOSIS — D2239 Melanocytic nevi of other parts of face: Secondary | ICD-10-CM | POA: Diagnosis not present

## 2018-10-14 DIAGNOSIS — L821 Other seborrheic keratosis: Secondary | ICD-10-CM | POA: Diagnosis not present

## 2018-10-14 DIAGNOSIS — L814 Other melanin hyperpigmentation: Secondary | ICD-10-CM | POA: Diagnosis not present

## 2018-10-14 DIAGNOSIS — D692 Other nonthrombocytopenic purpura: Secondary | ICD-10-CM | POA: Diagnosis not present

## 2018-10-14 DIAGNOSIS — L110 Acquired keratosis follicularis: Secondary | ICD-10-CM | POA: Diagnosis not present

## 2018-10-14 DIAGNOSIS — Z85828 Personal history of other malignant neoplasm of skin: Secondary | ICD-10-CM | POA: Diagnosis not present

## 2018-10-14 DIAGNOSIS — L570 Actinic keratosis: Secondary | ICD-10-CM | POA: Diagnosis not present

## 2018-10-14 DIAGNOSIS — D0439 Carcinoma in situ of skin of other parts of face: Secondary | ICD-10-CM | POA: Diagnosis not present

## 2018-10-14 DIAGNOSIS — D485 Neoplasm of uncertain behavior of skin: Secondary | ICD-10-CM | POA: Diagnosis not present

## 2018-10-14 DIAGNOSIS — D1801 Hemangioma of skin and subcutaneous tissue: Secondary | ICD-10-CM | POA: Diagnosis not present

## 2018-10-28 DIAGNOSIS — Z85828 Personal history of other malignant neoplasm of skin: Secondary | ICD-10-CM | POA: Diagnosis not present

## 2018-10-28 DIAGNOSIS — C44321 Squamous cell carcinoma of skin of nose: Secondary | ICD-10-CM | POA: Diagnosis not present

## 2019-03-03 DIAGNOSIS — Z23 Encounter for immunization: Secondary | ICD-10-CM | POA: Diagnosis not present

## 2019-03-16 DIAGNOSIS — L853 Xerosis cutis: Secondary | ICD-10-CM | POA: Diagnosis not present

## 2019-03-16 DIAGNOSIS — L814 Other melanin hyperpigmentation: Secondary | ICD-10-CM | POA: Diagnosis not present

## 2019-03-16 DIAGNOSIS — D1801 Hemangioma of skin and subcutaneous tissue: Secondary | ICD-10-CM | POA: Diagnosis not present

## 2019-03-16 DIAGNOSIS — Z85828 Personal history of other malignant neoplasm of skin: Secondary | ICD-10-CM | POA: Diagnosis not present

## 2019-03-16 DIAGNOSIS — L218 Other seborrheic dermatitis: Secondary | ICD-10-CM | POA: Diagnosis not present

## 2019-03-16 DIAGNOSIS — L821 Other seborrheic keratosis: Secondary | ICD-10-CM | POA: Diagnosis not present

## 2019-03-16 DIAGNOSIS — L570 Actinic keratosis: Secondary | ICD-10-CM | POA: Diagnosis not present

## 2019-03-16 DIAGNOSIS — D225 Melanocytic nevi of trunk: Secondary | ICD-10-CM | POA: Diagnosis not present

## 2019-03-16 DIAGNOSIS — D224 Melanocytic nevi of scalp and neck: Secondary | ICD-10-CM | POA: Diagnosis not present

## 2019-04-13 DIAGNOSIS — Z125 Encounter for screening for malignant neoplasm of prostate: Secondary | ICD-10-CM | POA: Diagnosis not present

## 2019-04-13 DIAGNOSIS — E7800 Pure hypercholesterolemia, unspecified: Secondary | ICD-10-CM | POA: Diagnosis not present

## 2019-04-13 DIAGNOSIS — Z0000 Encounter for general adult medical examination without abnormal findings: Secondary | ICD-10-CM | POA: Diagnosis not present

## 2019-04-13 DIAGNOSIS — Z79899 Other long term (current) drug therapy: Secondary | ICD-10-CM | POA: Diagnosis not present

## 2019-04-13 DIAGNOSIS — R7303 Prediabetes: Secondary | ICD-10-CM | POA: Diagnosis not present

## 2019-04-13 DIAGNOSIS — R0602 Shortness of breath: Secondary | ICD-10-CM | POA: Diagnosis not present

## 2019-04-13 DIAGNOSIS — Z0001 Encounter for general adult medical examination with abnormal findings: Secondary | ICD-10-CM | POA: Diagnosis not present

## 2019-06-10 DIAGNOSIS — Z961 Presence of intraocular lens: Secondary | ICD-10-CM | POA: Diagnosis not present

## 2019-06-10 DIAGNOSIS — H524 Presbyopia: Secondary | ICD-10-CM | POA: Diagnosis not present

## 2019-06-10 DIAGNOSIS — H35371 Puckering of macula, right eye: Secondary | ICD-10-CM | POA: Diagnosis not present

## 2019-09-13 DIAGNOSIS — E7800 Pure hypercholesterolemia, unspecified: Secondary | ICD-10-CM | POA: Diagnosis not present

## 2019-09-13 DIAGNOSIS — R7309 Other abnormal glucose: Secondary | ICD-10-CM | POA: Diagnosis not present

## 2019-09-14 DIAGNOSIS — L821 Other seborrheic keratosis: Secondary | ICD-10-CM | POA: Diagnosis not present

## 2019-09-14 DIAGNOSIS — L218 Other seborrheic dermatitis: Secondary | ICD-10-CM | POA: Diagnosis not present

## 2019-09-14 DIAGNOSIS — L720 Epidermal cyst: Secondary | ICD-10-CM | POA: Diagnosis not present

## 2019-09-14 DIAGNOSIS — Z85828 Personal history of other malignant neoplasm of skin: Secondary | ICD-10-CM | POA: Diagnosis not present

## 2019-09-14 DIAGNOSIS — L570 Actinic keratosis: Secondary | ICD-10-CM | POA: Diagnosis not present

## 2019-09-14 DIAGNOSIS — L820 Inflamed seborrheic keratosis: Secondary | ICD-10-CM | POA: Diagnosis not present

## 2019-09-17 ENCOUNTER — Telehealth (HOSPITAL_COMMUNITY): Payer: Self-pay | Admitting: Vascular Surgery

## 2019-09-17 NOTE — Telephone Encounter (Signed)
Left pt wife message that pt is not a candidate to be seen in [facility] clinic , pt will need to keep seeing Dr. Marlou Porch

## 2019-09-24 ENCOUNTER — Other Ambulatory Visit: Payer: Self-pay

## 2019-09-24 ENCOUNTER — Encounter: Payer: Self-pay | Admitting: Cardiology

## 2019-09-24 ENCOUNTER — Ambulatory Visit (INDEPENDENT_AMBULATORY_CARE_PROVIDER_SITE_OTHER): Payer: Medicare Other | Admitting: Cardiology

## 2019-09-24 VITALS — BP 118/80 | HR 73 | Ht 72.0 in | Wt 204.8 lb

## 2019-09-24 DIAGNOSIS — R931 Abnormal findings on diagnostic imaging of heart and coronary circulation: Secondary | ICD-10-CM | POA: Diagnosis not present

## 2019-09-24 DIAGNOSIS — I209 Angina pectoris, unspecified: Secondary | ICD-10-CM

## 2019-09-24 DIAGNOSIS — R0602 Shortness of breath: Secondary | ICD-10-CM

## 2019-09-24 MED ORDER — METOPROLOL TARTRATE 50 MG PO TABS: 50.0000 mg | ORAL_TABLET | Freq: Once | ORAL | 0 refills | Status: DC

## 2019-09-24 NOTE — Progress Notes (Signed)
Cardiology Office Note:    Date:  09/24/2019   ID:  Cody Haas, DOB 08/14/1945, MRN [id number]  PCP:  Koirala, Ashish, MD  Cardiologist:  Mark Skains, MD  Electrophysiologist:  None   Referring MD: Koirala, Ashish, MD     History of Present Illness:    Cody Haas is a 74 y.o. male here for the evaluation of chest pain at request of Dr. Koirala.   Seen over 3 years ago, was having mild chest pressure, SOB relieved with rest. Prior ETT and NUC was negative.   At last visit with Dr. Koirala was having shortness of breath on exertion.  Stairs are hard for him.  This could be his anginal equivalent.  Pretty significant shortness of breath he states.  When doing yard work as well, he has had more fatigue, windedness.   Son had ablation.   No smoking.   Maternal grandfather died at age 63 from MI. he is concerned because his mother had stroke around his age.  He spends half of his time in [place] and the other half of this time in the [place].  ECG: 08/26/16-sinus rhythm 68 with no other abnormalities. T-wave flattening noted in V2.   Past Medical History:  Diagnosis Date  . Chest pain   . Dizziness     History reviewed. No pertinent surgical history.  Current Medications: Current Meds  Medication Sig  . Multiple Vitamin (MULTIVITAMIN) capsule Take 1 capsule by mouth daily.  . Omega-3 Fatty Acids (FISH OIL PO) Take 1 tablet by mouth daily.     Allergies:   Penicillin g   Social History   Socioeconomic History  . Marital status: Married    Spouse name: Not on file  . Number of children: Not on file  . Years of education: Not on file  . Highest education level: Not on file  Occupational History  . Not on file  Tobacco Use  . Smoking status: Never Smoker  . Smokeless tobacco: Never Used  Substance and Sexual Activity  . Alcohol use: No  . Drug use: No  . Sexual activity: Not on file  Other Topics Concern  . Not on file  Social History Narrative  .  Not on file   Social Determinants of Health   Financial Resource Strain:   . Difficulty of Paying Living Expenses:   Food Insecurity:   . Worried About Running Out of Food in the Last Year:   . Ran Out of Food in the Last Year:   Transportation Needs:   . Lack of Transportation (Medical):   . Lack of Transportation (Non-Medical):   Physical Activity:   . Days of Exercise per Week:   . Minutes of Exercise per Session:   Stress:   . Feeling of Stress :   Social Connections:   . Frequency of Communication with Friends and Family:   . Frequency of Social Gatherings with Friends and Family:   . Attends Religious Services:   . Active Member of Clubs or Organizations:   . Attends Club or Organization Meetings:   . Marital Status:      ROS:   Please see the history of present illness.     All other systems reviewed and are negative.  EKGs/Labs/Other Studies Reviewed:    The following studies were reviewed today: As above, prior office notes.  Care physician.  Lab work from primary care physician.  Prior stress test results reviewed.  EKG:  EKG is  ordered   today.  The ekg ordered today demonstrates sinus rhythm 73 no other significant abnormalities-prior sinus rhythm 71 nonspecific T wave changes.  Recent Labs: No results found for requested labs within last 8760 hours.  Recent Lipid Panel No results found for: CHOL, TRIG, HDL, CHOLHDL, VLDL, LDLCALC, LDLDIRECT  Physical Exam:    VS:  BP 118/80   Pulse 73   Ht 6' (1.829 m)   Wt 204 lb 12.8 oz (92.9 kg)   SpO2 98%   BMI 27.78 kg/m     Wt Readings from Last 3 Encounters:  09/24/19 204 lb 12.8 oz (92.9 kg)  09/03/16 209 lb (94.8 kg)     GEN:  Well nourished, well developed in no acute distress HEENT: Normal NECK: No JVD; No carotid bruits LYMPHATICS: No lymphadenopathy CARDIAC: RRR, no murmurs, rubs, gallops RESPIRATORY:  Clear to auscultation without rales, wheezing or rhonchi  ABDOMEN: Soft, non-tender,  non-distended MUSCULOSKELETAL:  No edema; No deformity  SKIN: Warm and dry NEUROLOGIC:  Alert and oriented x 3 PSYCHIATRIC:  Normal affect   ASSESSMENT:    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 Shortness of breath   3. Abnormal findings on diagnostic imaging of heart and coronary circulation     PLAN:    In order of problems listed above:  Dyspnea on exertion -Could be an anginal equivalent.  Strong family history.  We will go ahead and order coronary CT scan with possible FFR.  Based upon these results, may need either further testing or more intensive medical management.  Prior LDL 164 hemoglobin A1c 5.9 hemoglobin 14.7 creatinine 1.02 ALT 19.  Family history of stroke  - Carotid Dopplers 2018  - IMPRESSION: Less than 50% stenosis in the right and left internal carotid arteries.  Hyperlipidemia  - Agree with statin. Continue to monitor per Dr. Dorthy Cooler  - Prevention.  - Diet, exercise.   Medication Adjustments/Labs and Tests Ordered: Current medicines are reviewed at length with the patient today.  Concerns regarding medicines are outlined above.  Orders Placed This Encounter  Procedures  . CT CORONARY MORPH W/CTA COR W/SCORE W/CA W/CM &/OR WO/CM  . CT CORONARY FRACTIONAL FLOW RESERVE DATA PREP  . CT CORONARY FRACTIONAL FLOW RESERVE FLUID ANALYSIS  . Basic metabolic panel  . EKG 12-Lead   Meds ordered this encounter  Medications  . metoprolol tartrate (LOPRESSOR) 50 MG tablet    Sig: Take 1 tablet (50 mg total) by mouth once for 1 dose. Take 2 hours prior to your Coronary CT.    Dispense:  1 tablet    Refill:  0    Patient Instructions  Medication Instructions:   Your physician recommends that you continue on your current medications as directed. Please refer to the Current Medication list given to you today.  *If you need a refill on your cardiac medications before your next appointment, please call your pharmacy*  Testing/Procedures:  Your cardiac CT will be  scheduled at one of the below locations:   [facility] [address] [place], [place] [address] [phone]  If scheduled at [facility], please arrive at the [facility] main entrance of [facility] 30 minutes prior to test start time. Proceed to the [facility] Radiology Department (first floor) to check-in and test prep. Please follow these instructions carefully (unless otherwise directed):  Hold all erectile dysfunction medications at least 3 days (72 hrs) prior to test.  On the Night Before the Test: . Be sure to  Drink plenty of water. . Do not consume any caffeinated/decaffeinated beverages or chocolate 12 hours prior to your test. . Do not take any antihistamines 12 hours prior to your test.  On the Day of the Test: . Drink plenty of water. Do not drink any water within one hour of the test. . Do not eat any food 4 hours prior to the test. . You may take your regular medications prior to the test.  . Take metoprolol 50 mg by mouth (Lopressor) two hours prior to test      After the Test: . Drink plenty of water. . After receiving IV contrast, you may experience a mild flushed feeling. This is normal. . On occasion, you may experience a mild rash up to 24 hours after the test. This is not dangerous. If this occurs, you can take Benadryl 25 mg and increase your fluid intake. . If you experience trouble breathing, this can be serious. If it is severe call 911 IMMEDIATELY. If it is mild, please call our office.   Once we have confirmed authorization from your insurance company, we will call you to set up a date and time for your test.   For non-scheduling related questions, please contact the cardiac imaging nurse navigator should you have any questions/concerns: Sara Wallace, Cardiac Imaging Nurse Navigator Merle Tai, Interim Cardiac Imaging Nurse Navigator [facility] Direct Office Dial: [phone]   For  scheduling needs, including cancellations and rescheduling, please call [phone].    Follow-Up: At [facility], you and your health needs are our priority.  As part of our continuing mission to provide you with exceptional heart care, we have created designated Provider Care Teams.  These Care Teams include your primary Cardiologist (physician) and Advanced Practice Providers (APPs -  Physician Assistants and Nurse Practitioners) who all work together to provide you with the care you need, when you need it.  We recommend signing up for the patient portal called "MyChart".  Sign up information is provided on this After Visit Summary.  MyChart is used to connect with patients for Virtual Visits (Telemedicine).  Patients are able to view lab/test results, encounter notes, upcoming appointments, etc.  Non-urgent messages can be sent to your provider as well.   To learn more about what you can do with MyChart, go to [email or web address].    Your next appointment:   12 month(s)  The format for your next appointment:   In Person  Provider:   Mark Skains, MD       Signed, Mark Skains, MD  09/24/2019 8:55 AM    [facility] 

## 2019-09-24 NOTE — Patient Instructions (Signed)
Medication Instructions:   Your physician recommends that you continue on your current medications as directed. Please refer to the Current Medication list given to you today.  *If you need a refill on your cardiac medications before your next appointment, please call your pharmacy*  Testing/Procedures:  Your cardiac CT will be scheduled at one of the below locations:   [facility] [address] [place], [place] [address] [phone]  If scheduled at [facility], please arrive at the [facility] main entrance of [facility] 30 minutes prior to test start time. Proceed to the [facility] Radiology Department (first floor) to check-in and test prep. Please follow these instructions carefully (unless otherwise directed):  Hold all erectile dysfunction medications at least 3 days (72 hrs) prior to test.  On the Night Before the Test: . Be sure to Drink plenty of water. . Do not consume any caffeinated/decaffeinated beverages or chocolate 12 hours prior to your test. . Do not take any antihistamines 12 hours prior to your test.  On the Day of the Test: . Drink plenty of water. Do not drink any water within one hour of the test. . Do not eat any food 4 hours prior to the test. . You may take your regular medications prior to the test.  . Take metoprolol 50 mg by mouth (Lopressor) two hours prior to test      After the Test: . Drink plenty of water. . After receiving IV contrast, you may experience a mild flushed feeling. This is normal. . On occasion, you may experience a mild rash up to 24 hours after the test. This is not dangerous. If this occurs, you can take Benadryl 25 mg and increase your fluid intake. . If you experience trouble breathing, this can be serious. If it is severe call 911 IMMEDIATELY. If it is mild, please call our office.   Once we have confirmed authorization from your insurance company, we will call you to set up a date and time  for your test.   For non-scheduling related questions, please contact the cardiac imaging nurse navigator should you have any questions/concerns: Marchia Bond, Cardiac Imaging Nurse Navigator Burley Saver, Interim Cardiac Imaging Nurse [facility] Direct Office Dial: [phone]   For scheduling needs, including cancellations and rescheduling, please call [phone].    Follow-Up: At [facility], you and your health needs are our priority.  As part of our continuing mission to provide you with exceptional heart care, we have created designated Provider Care Teams.  These Care Teams include your primary Cardiologist (physician) and Advanced Practice Providers (APPs -  Physician Assistants and Nurse Practitioners) who all work together to provide you with the care you need, when you need it.  We recommend signing up for the patient portal called "MyChart".  Sign up information is provided on this After Visit Summary.  MyChart is used to connect with patients for Virtual Visits (Telemedicine).  Patients are able to view lab/test results, encounter notes, upcoming appointments, etc.  Non-urgent messages can be sent to your provider as well.   To learn more about what you can do with MyChart, go to [email or web address].    Your next appointment:   12 month(s)  The format for your next appointment:   In Person  Provider:   Candee Furbish, MD

## 2019-10-06 DIAGNOSIS — H52203 Unspecified astigmatism, bilateral: Secondary | ICD-10-CM | POA: Diagnosis not present

## 2019-10-06 DIAGNOSIS — H26492 Other secondary cataract, left eye: Secondary | ICD-10-CM | POA: Diagnosis not present

## 2019-10-06 DIAGNOSIS — H35371 Puckering of macula, right eye: Secondary | ICD-10-CM | POA: Diagnosis not present

## 2019-10-06 DIAGNOSIS — H524 Presbyopia: Secondary | ICD-10-CM | POA: Diagnosis not present

## 2019-10-07 DIAGNOSIS — H26492 Other secondary cataract, left eye: Secondary | ICD-10-CM | POA: Diagnosis not present

## 2019-10-13 ENCOUNTER — Other Ambulatory Visit: Payer: Self-pay

## 2019-10-13 ENCOUNTER — Other Ambulatory Visit: Payer: Medicare Other | Admitting: *Deleted

## 2019-10-13 ENCOUNTER — Telehealth (HOSPITAL_COMMUNITY): Payer: Self-pay | Admitting: *Deleted

## 2019-10-13 DIAGNOSIS — I209 Angina pectoris, unspecified: Secondary | ICD-10-CM

## 2019-10-13 DIAGNOSIS — R0602 Shortness of breath: Secondary | ICD-10-CM

## 2019-10-13 LAB — BASIC METABOLIC PANEL
BUN/Creatinine Ratio: 15 (ref 10–24)
BUN: 17 mg/dL (ref 8–27)
CO2: 20 mmol/L (ref 20–29)
Calcium: 9.2 mg/dL (ref 8.6–10.2)
Chloride: 105 mmol/L (ref 96–106)
Creatinine, Ser: 1.14 mg/dL (ref 0.76–1.27)
GFR calc Af Amer: 73 mL/min/{1.73_m2} (ref >59)
GFR calc non Af Amer: 63 mL/min/{1.73_m2} (ref >59)
Glucose: 101 mg/dL — ABNORMAL HIGH (ref 65–99)
Potassium: 4.5 mmol/L (ref 3.5–5.2)
Sodium: 142 mmol/L (ref 134–144)

## 2019-10-13 NOTE — Telephone Encounter (Signed)
Reaching out to patient to offer assistance regarding upcoming cardiac imaging study; pt verbalizes understanding of appt date/time, parking situation and where to check in, pre-test NPO status and medications ordered, and verified current allergies; name and call back number provided for further questions should they arise  Mariafernanda Hendricksen Tai RN Navigator Cardiac Imaging [facility] [phone] office [phone] cell 

## 2019-10-14 ENCOUNTER — Ambulatory Visit (HOSPITAL_COMMUNITY)
Admission: RE | Admit: 2019-10-14 | Discharge: 2019-10-14 | Disposition: A | Discharge status: Home / Self Care | Payer: Medicare Other | Source: Ambulatory Visit | Attending: Cardiology | Admitting: Cardiology

## 2019-10-14 DIAGNOSIS — I209 Angina pectoris, unspecified: Secondary | ICD-10-CM | POA: Diagnosis not present

## 2019-10-14 DIAGNOSIS — R0602 Shortness of breath: Secondary | ICD-10-CM

## 2019-10-14 DIAGNOSIS — R931 Abnormal findings on diagnostic imaging of heart and coronary circulation: Secondary | ICD-10-CM

## 2019-10-14 IMAGING — CT CT HEART MORP W/ CTA COR W/ SCORE W/ CA W/CM &/OR W/O CM
4 of 7 series · 8 of 20 positions shown, 9 images · non-contrast
Comparison: None.
COMPARISON: None.

Addendum:
EXAM:
OVER-READ INTERPRETATION  CT CHEST

The following report is an over-read performed by radiologist Dr.
[place] Bove [REDACTED] on 10/14/2019. This
over-read does not include interpretation of cardiac or coronary
anatomy or pathology. The coronary calcium score/coronary CTA
interpretation by the cardiologist is attached.
CLINICAL DATA: 74 year old with angina, hyperlipidemia.
Cardiac/Coronary  CTA
TECHNIQUE: The patient was scanned on a Phillips Force scanner.

[Series 6: best diast 72 % · axial · 0.39mm/px · z∈[-186,-139]mm · 2 of 358 slices shown]
[im 120/358  vessel]
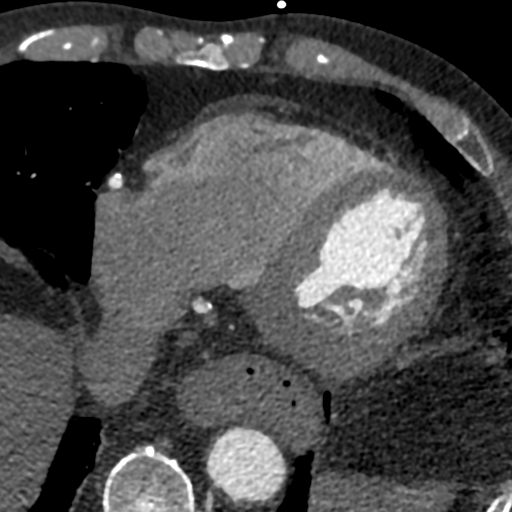
[im 239/358  vessel]
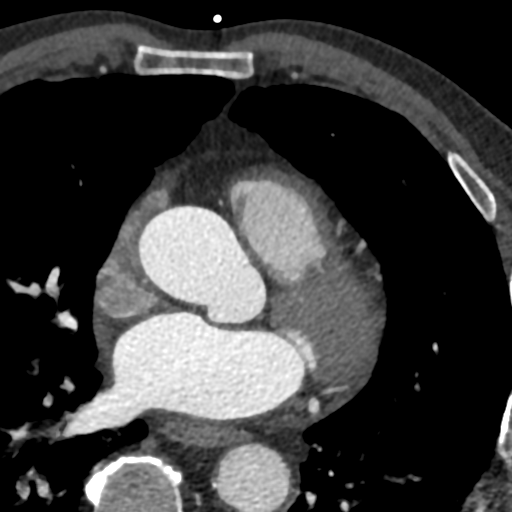

[Series 7: best syst · axial · 0.39mm/px · z∈[-186,-139]mm · 2 of 358 slices shown, 3 images]
[im 120/358  vessel]
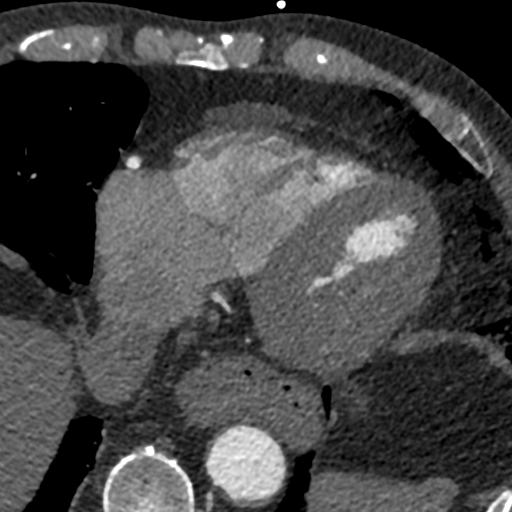
[im 120/358  lung]
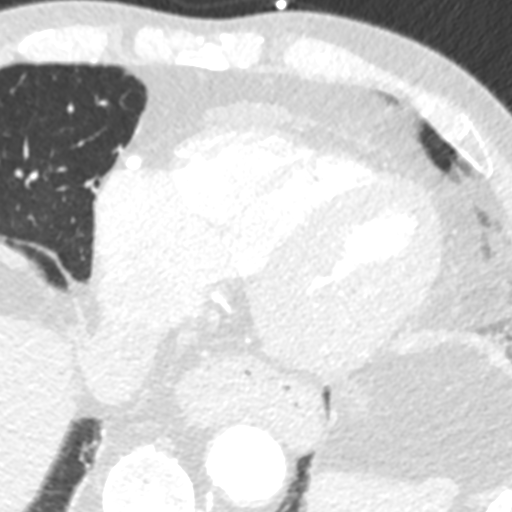
[im 239/358  vessel]
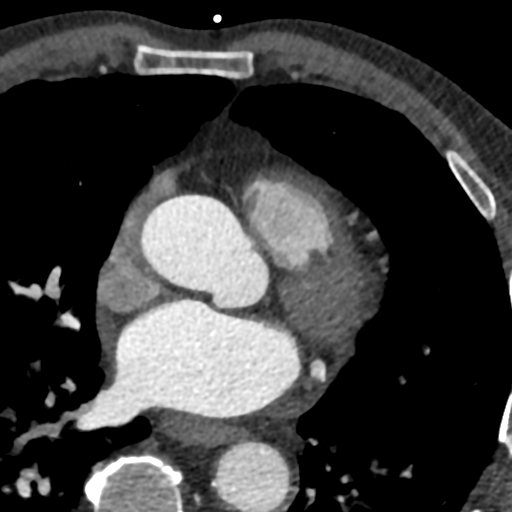

[Series 9: ts diast sharp · axial · 0.39mm/px · z∈[-186,-139]mm · 2 of 358 slices shown]
[im 120/358  lung]
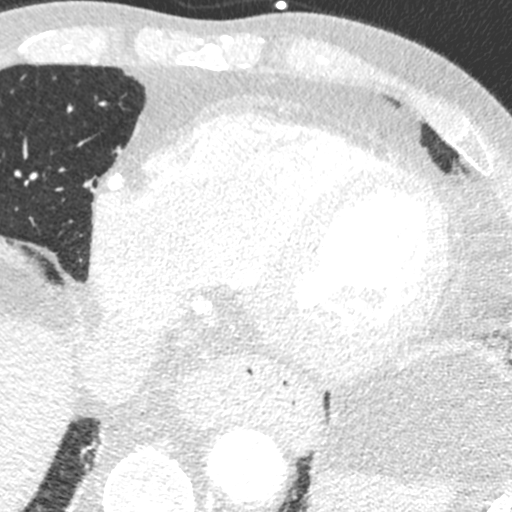
[im 239/358  lung]
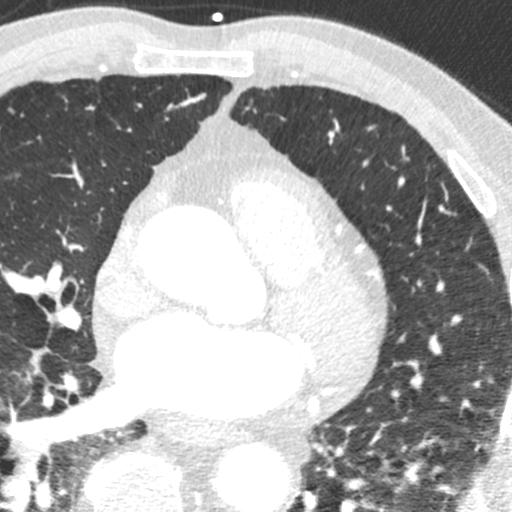

[Series 10: ts syst sharp · axial · 0.39mm/px · z∈[-186,-139]mm · 2 of 358 slices shown]
[im 120/358  lung]
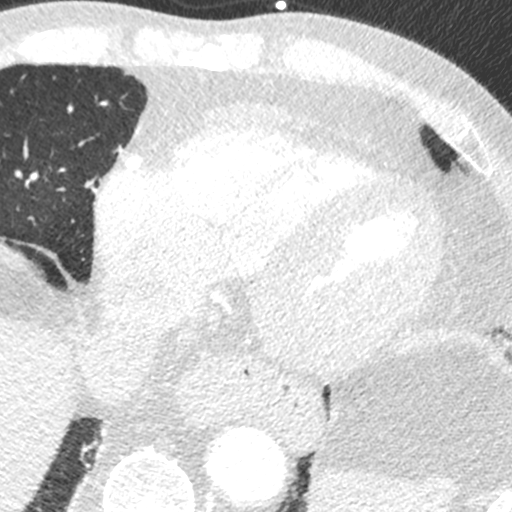
[im 239/358  lung]
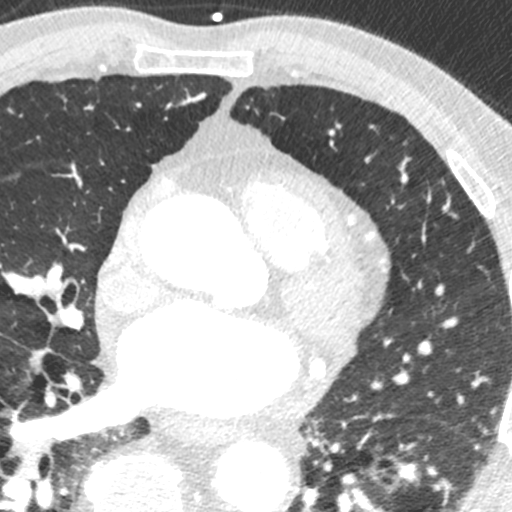

[8 of 20 positions shown; findings below may reference images not displayed]

FINDINGS: Aortic atherosclerosis. Within the visualized portions of the thorax
there are no suspicious appearing pulmonary nodules or masses, there
is no acute consolidative airspace disease, no pleural effusions, no
pneumothorax and no lymphadenopathy. Visualized portions of the
upper abdomen are unremarkable. There are no aggressive appearing
lytic or blastic lesions noted in the visualized portions of the
skeleton.
IMPRESSION: 1.  Aortic Atherosclerosis (DI2P5-SWF.F).
FINDINGS: A 100 kV prospective scan was triggered in the descending thoracic
aorta at 111 HU's. Axial non-contrast 3 mm slices were carried out
through the heart. The data set was analyzed on a dedicated work
station and scored using the Agatson method. Gantry rotation speed
was 250 msecs and collimation was .6 mm. No beta blockade and 0.8 mg
of sl NTG was given. The 3D data set was reconstructed in 5%
intervals of the 67-82 % of the R-R cycle. Diastolic phases were
analyzed on a dedicated work station using MPR, MIP and VRT modes.
The patient received 80 cc of contrast.

Aorta:  Normal size.  Moderate calcifications.  No dissection.

Aortic Valve:  Trileaflet.  Mild calcifications.

Coronary Arteries:  Normal coronary origin.  Right dominance.

RCA is a large dominant artery that gives rise to PDA and PLA. There
is diffuse calcified plaque. Ostial RCA soft plaque 24-50%.

Left main is a large artery that gives rise to LAD and LCX arteries.
There is ostial calcified plaque, 0-24%

LAD is a large vessel that has diffuse calcified plaque majority
0-24%, but mid segment and distal appear 50-74%. Sending for FFR.
Large diagonal branch also diffusely calcified and 50-74% lesions
noted.

LCX is a non-dominant artery that gives rise to one large OM1
branch. There is calcified diffuse plaque, 0-24%.

Other findings:

Normal pulmonary vein drainage into the left atrium.

Normal left atrial appendage without a thrombus.

Normal size of the pulmonary artery.

Please see radiology report for non cardiac findings.
IMPRESSION: 1. Coronary calcium score of 4456. This was 92 percentile for age
and sex matched control.

2. Normal coronary origin with right dominance.

3. There is severe calcified dense plaque diffusely with regions of
possible flow limiting disease, will send for CT-FFR analysis. See
above for details.

*** End of Addendum ***
EXAM:
OVER-READ INTERPRETATION  CT CHEST

The following report is an over-read performed by radiologist Dr.
[place] B[place] [REDACTED] on 10/14/2019. This
over-read does not include interpretation of cardiac or coronary
anatomy or pathology. The coronary calcium score/coronary CTA
interpretation by the cardiologist is attached.
FINDINGS: Aortic atherosclerosis. Within the visualized portions of the thorax
there are no suspicious appearing pulmonary nodules or masses, there
is no acute consolidative airspace disease, no pleural effusions, no
pneumothorax and no lymphadenopathy. Visualized portions of the
upper abdomen are unremarkable. There are no aggressive appearing
lytic or blastic lesions noted in the visualized portions of the
skeleton.
IMPRESSION: 1.  Aortic Atherosclerosis (DI2P5-SWF.F).

## 2019-10-14 MED ORDER — NITROGLYCERIN 0.4 MG SL SUBL
SUBLINGUAL_TABLET | SUBLINGUAL | Status: AC
  Filled 2019-10-14: qty 2

## 2019-10-14 MED ORDER — NITROGLYCERIN 0.4 MG SL SUBL
0.8000 mg | SUBLINGUAL_TABLET | Freq: Once | SUBLINGUAL | Status: AC
  Administered 2019-10-14: 0.8 mg via SUBLINGUAL

## 2019-10-14 MED ORDER — IOHEXOL 350 MG/ML SOLN
80.0000 mL | Freq: Once | INTRAVENOUS | Status: AC | PRN
  Administered 2019-10-14: 80 mL via INTRAVENOUS

## 2019-10-15 DIAGNOSIS — I209 Angina pectoris, unspecified: Secondary | ICD-10-CM | POA: Diagnosis not present

## 2019-10-15 DIAGNOSIS — R931 Abnormal findings on diagnostic imaging of heart and coronary circulation: Secondary | ICD-10-CM | POA: Diagnosis not present

## 2019-10-15 DIAGNOSIS — Z6827 Body mass index (BMI) 27.0-27.9, adult: Secondary | ICD-10-CM | POA: Diagnosis not present

## 2019-10-19 ENCOUNTER — Telehealth: Payer: Self-pay

## 2019-10-19 DIAGNOSIS — E785 Hyperlipidemia, unspecified: Secondary | ICD-10-CM

## 2019-10-19 DIAGNOSIS — Z79899 Other long term (current) drug therapy: Secondary | ICD-10-CM

## 2019-10-19 MED ORDER — ROSUVASTATIN CALCIUM 20 MG PO TABS
20.0000 mg | ORAL_TABLET | Freq: Every day | ORAL | 3 refills | Status: DC
Start: 2019-10-19 — End: 2020-04-25

## 2019-10-19 NOTE — Telephone Encounter (Signed)
The patient has been notified of the Coronary CT result and verbalized understanding.  All questions (if any) were answered. Frederik Schmidt, RN 10/19/2019 8:17 AM   Patient started on Crestor 20 mg Daily and labs 01/26/20.

## 2019-10-19 NOTE — Telephone Encounter (Signed)
-----   Message from Jerline Pain, MD sent at 10/19/2019  6:59 AM EDT ----- Calcified plaque, diffuse, with high calcium score 2263, 92 % for age. Lesions are moderate in severity (non flow limiting on FFR analysis). Would recommend aggressive secondary risk factor prevention, Crestor 20mg  PO QD with lipid panel in 3 months and ALT, to reduce risk of heart attack. Please have follow up to discuss.   Candee Furbish, MD

## 2019-11-08 ENCOUNTER — Encounter: Payer: Self-pay | Admitting: *Deleted

## 2019-12-09 ENCOUNTER — Ambulatory Visit: Payer: Medicare Other | Admitting: Cardiology

## 2020-01-18 ENCOUNTER — Ambulatory Visit (INDEPENDENT_AMBULATORY_CARE_PROVIDER_SITE_OTHER): Payer: Medicare Other | Admitting: Cardiology

## 2020-01-18 ENCOUNTER — Other Ambulatory Visit: Payer: Self-pay

## 2020-01-18 ENCOUNTER — Encounter: Payer: Self-pay | Admitting: Cardiology

## 2020-01-18 VITALS — BP 128/84 | HR 55 | Ht 72.0 in | Wt 202.2 lb

## 2020-01-18 DIAGNOSIS — E785 Hyperlipidemia, unspecified: Secondary | ICD-10-CM

## 2020-01-18 DIAGNOSIS — I209 Angina pectoris, unspecified: Secondary | ICD-10-CM

## 2020-01-18 DIAGNOSIS — Z79899 Other long term (current) drug therapy: Secondary | ICD-10-CM | POA: Diagnosis not present

## 2020-01-18 DIAGNOSIS — I2510 Atherosclerotic heart disease of native coronary artery without angina pectoris: Secondary | ICD-10-CM

## 2020-01-18 LAB — LIPID PANEL
Chol/HDL Ratio: 3.6 ratio (ref 0.0–5.0)
Cholesterol, Total: 159 mg/dL (ref 100–199)
HDL: 44 mg/dL (ref >39)
LDL Chol Calc (NIH): 98 mg/dL (ref 0–99)
Triglycerides: 93 mg/dL (ref 0–149)
VLDL Cholesterol Cal: 17 mg/dL (ref 5–40)

## 2020-01-18 LAB — ALT: ALT: 20 IU/L (ref 0–44)

## 2020-01-18 NOTE — Progress Notes (Signed)
Cardiology Office Note:    Date:  01/18/2020   ID:  Cody Haas, DOB June 03, 1945, MRN [id number]  PCP:  Stephani Police, MD  [facility] Cardiologist:  Candee Furbish, MD  [facility] Electrophysiologist:  None   Referring MD: Stephani Police, MD     History of Present Illness:    Cody Haas is a 74 y.o. male here for the follow-up of chest discomfort.  Prior ETT nuclear stress test several years ago was negative.  Had continued shortness of breath with exertion.  Spends half of his time in the [place].  Maternal grandfather died at age 49 from MI.  Mother also had a stroke around 69.  Coronary calcium was highly elevated at 2200, despite this, vessels were large in caliber and FFR was normal.  Continued with aggressive medical management.  Mild shoulder discomfort at times.  Wonders if it is the new start Crestor.  He was concerned about the dose of 20 mg.  We discussed at length high intensity statin with his coronary artery disease.  He will occasionally take a tick off of his body.  He was wondering about Lyme disease.  He has discussed with Dr. Dorthy Cooler.  No chest pain currently.  Will feel some shortness of breath with exertional activity.  Continue to work on conditioning.  Past Medical History:  Diagnosis Date  . Chest pain   . Dizziness     No past surgical history on file.  Current Medications: Current Meds  Medication Sig  . metoprolol tartrate (LOPRESSOR) 50 MG tablet Take 1 tablet (50 mg total) by mouth once for 1 dose. Take 2 hours prior to your Coronary CT.  . Multiple Vitamin (MULTIVITAMIN) capsule Take 1 capsule by mouth daily.  . Omega-3 Fatty Acids (FISH OIL PO) Take 1 tablet by mouth daily.  . rosuvastatin (CRESTOR) 20 MG tablet Take 1 tablet (20 mg total) by mouth daily.     Allergies:   Penicillin g   Social History   Socioeconomic History  . Marital status: Married    Spouse name: Not on file  . Number of children: Not on file  .  Years of education: Not on file  . Highest education level: Not on file  Occupational History  . Not on file  Tobacco Use  . Smoking status: Never Smoker  . Smokeless tobacco: Never Used  Substance and Sexual Activity  . Alcohol use: No  . Drug use: No  . Sexual activity: Not on file  Other Topics Concern  . Not on file  Social History Narrative  . Not on file   Social Determinants of Health   Financial Resource Strain:   . Difficulty of Paying Living Expenses: Not on file  Food Insecurity:   . Worried About Charity fundraiser in the Last Year: Not on file  . Ran Out of Food in the Last Year: Not on file  Transportation Needs:   . Lack of Transportation (Medical): Not on file  . Lack of Transportation (Non-Medical): Not on file  Physical Activity:   . Days of Exercise per Week: Not on file  . Minutes of Exercise per Session: Not on file  Stress:   . Feeling of Stress : Not on file  Social Connections:   . Frequency of Communication with Friends and Family: Not on file  . Frequency of Social Gatherings with Friends and Family: Not on file  . Attends Religious Services: Not on file  . Active Member  of Clubs or Organizations: Not on file  . Attends Archivist Meetings: Not on file  . Marital Status: Not on file     ROS:   Please see the history of present illness.     All other systems reviewed and are negative.  EKGs/Labs/Other Studies Reviewed:     Recent Labs: 10/13/2019: BUN 17; Creatinine, Ser 1.14; Potassium 4.5; Sodium 142  Recent Lipid Panel No results found for: CHOL, TRIG, HDL, CHOLHDL, VLDL, LDLCALC, LDLDIRECT  Physical Exam:    VS:  BP 128/84   Pulse (!) 55   Ht 6' (1.829 m)   Wt 202 lb 3.2 oz (91.7 kg)   SpO2 97%   BMI 27.42 kg/m     Wt Readings from Last 3 Encounters:  01/18/20 202 lb 3.2 oz (91.7 kg)  09/24/19 204 lb 12.8 oz (92.9 kg)  09/03/16 209 lb (94.8 kg)     GEN:  Well nourished, well developed in no acute  distress HEENT: Normal NECK: No JVD; No carotid bruits LYMPHATICS: No lymphadenopathy CARDIAC: RRR, no murmurs, rubs, gallops RESPIRATORY:  Clear to auscultation without rales, wheezing or rhonchi  ABDOMEN: Soft, non-tender, non-distended MUSCULOSKELETAL:  No edema; No deformity  SKIN: Warm and dry NEUROLOGIC:  Alert and oriented x 3 PSYCHIATRIC:  Normal affect   ASSESSMENT:    1. Hyperlipidemia, unspecified hyperlipidemia type   2. Medication management   3. Coronary artery disease involving native coronary artery of native heart without angina pectoris    PLAN:    In order of problems listed above:  Coronary artery disease -Severely elevated coronary calcium score in the 2000 range.  His FFR, fractional flow reserve analysis on CT scan did not show any significant flow limitation however.  For now, continue with medical management.  We have introduced Crestor 20 mg a day.  Overall he has been doing fairly well with this medication.  No significant myalgias.  Discussed with him at length the importance of high intensity statin for his heart attack stroke reduction. -Would recommend a low-dose aspirin 81 mg.  He is not too eager to take an additional medication.  Hyperlipidemia -Last LDL 136 from outside labs.  Last A1C 5.9, ALT 19. -Continue to encourage Crestor 20 mg a day. -Check lipid panel and ALT today.   Medication Adjustments/Labs and Tests Ordered: Current medicines are reviewed at length with the patient today.  Concerns regarding medicines are outlined above.  Orders Placed This Encounter  Procedures  . ALT  . Lipid panel   No orders of the defined types were placed in this encounter.   Patient Instructions  Medication Instructions:  The current medical regimen is effective;  continue present plan and medications.  *If you need a refill on your cardiac medications before your next appointment, please call your pharmacy*  Lab Work: Please have blood work  today. Lipid/ALT If you have labs (blood work) drawn today and your tests are completely normal, you will receive your results only by: Marland Kitchen MyChart Message (if you have MyChart) OR . A paper copy in the mail If you have any lab test that is abnormal or we need to change your treatment, we will call you to review the results.  Follow-Up: At [facility], you and your health needs are our priority.  As part of our continuing mission to provide you with exceptional heart care, we have created designated Provider Care Teams.  These Care Teams include your primary Cardiologist (physician) and Advanced Practice  Providers (APPs -  Physician Assistants and Nurse Practitioners) who all work together to provide you with the care you need, when you need it.  We recommend signing up for the patient portal called "MyChart".  Sign up information is provided on this After Visit Summary.  MyChart is used to connect with patients for Virtual Visits (Telemedicine).  Patients are able to view lab/test results, encounter notes, upcoming appointments, etc.  Non-urgent messages can be sent to your provider as well.   To learn more about what you can do with MyChart, go to [email or web address].    Your next appointment:   12 month(s)  The format for your next appointment:   In Person  Provider:   Candee Furbish, MD  Thank you for choosing [facility]!!        Signed, Candee Furbish, MD  01/18/2020 11:54 AM    [place]

## 2020-01-18 NOTE — Patient Instructions (Signed)
Medication Instructions:  The current medical regimen is effective;  continue present plan and medications.  *If you need a refill on your cardiac medications before your next appointment, please call your pharmacy*  Lab Work: Please have blood work today. Lipid/ALT If you have labs (blood work) drawn today and your tests are completely normal, you will receive your results only by: Marland Kitchen MyChart Message (if you have MyChart) OR . A paper copy in the mail If you have any lab test that is abnormal or we need to change your treatment, we will call you to review the results.  Follow-Up: At [facility], you and your health needs are our priority.  As part of our continuing mission to provide you with exceptional heart care, we have created designated Provider Care Teams.  These Care Teams include your primary Cardiologist (physician) and Advanced Practice Providers (APPs -  Physician Assistants and Nurse Practitioners) who all work together to provide you with the care you need, when you need it.  We recommend signing up for the patient portal called "MyChart".  Sign up information is provided on this After Visit Summary.  MyChart is used to connect with patients for Virtual Visits (Telemedicine).  Patients are able to view lab/test results, encounter notes, upcoming appointments, etc.  Non-urgent messages can be sent to your provider as well.   To learn more about what you can do with MyChart, go to [email or web address].    Your next appointment:   12 month(s)  The format for your next appointment:   In Person  Provider:   Candee Furbish, MD  Thank you for choosing [facility]!!

## 2020-01-20 ENCOUNTER — Telehealth: Payer: Self-pay | Admitting: *Deleted

## 2020-01-20 DIAGNOSIS — E785 Hyperlipidemia, unspecified: Secondary | ICD-10-CM

## 2020-01-20 MED ORDER — EZETIMIBE 10 MG PO TABS
10.0000 mg | ORAL_TABLET | Freq: Every day | ORAL | 3 refills | Status: DC
Start: 2020-01-20 — End: 2020-10-03

## 2020-01-20 NOTE — Telephone Encounter (Signed)
Patient notified of lab results.  He is willing to start Zetia.  Will send prescription to [facility] on [address].  He will come in for fasting lab work on 04/17/20.

## 2020-01-20 NOTE — Telephone Encounter (Signed)
-----   Message from Jerline Pain, MD sent at 01/18/2020  5:06 PM EDT ----- LDL 98.  Reduced from 179. Ultimately, our goal is for LDL less than 70.  Clearly the Crestor 20 mg is working well but I would like to see an even lower number.  I know today he was hesitant about the medication. If he is willing, I would like for him to add Zetia 10 mg a day to his Crestor 20 mg.  Remember, his calcium score was greater than 2000.  Trying to minimize risks of heart attack.  If he starts the Zetia 10 mg, have him repeat a lipid panel in 3 months.  Candee Furbish, MD

## 2020-01-24 ENCOUNTER — Telehealth: Payer: Self-pay | Admitting: Cardiology

## 2020-01-24 DIAGNOSIS — E785 Hyperlipidemia, unspecified: Secondary | ICD-10-CM

## 2020-01-24 NOTE — Telephone Encounter (Signed)
I spoke with patient who is requesting recent lipid /ALT results and cardiac CT results be mailed to him. Will send. Address in chart is correct. Patient reports he has read about statins and is concerned about side effects.  He reports he has muscle aches, itching,fatigue and recent hot flash.  He read lab work should be checked--creatinine kinase, potassium and myoglobin and is asking if Dr Marlou Porch would order these. Has not picked up Zetia and is researching this medication before starting it

## 2020-01-24 NOTE — Telephone Encounter (Signed)
New message:   Patient calling concering some results. Please call patient.

## 2020-01-25 NOTE — Telephone Encounter (Signed)
Please have him set up a visit with lipid clinic to discuss concerns.  Thanks  Candee Furbish, MD

## 2020-01-25 NOTE — Telephone Encounter (Signed)
Spoke with pt and made him aware of recommendations from Dr. Marlou Porch.  Pt agreeable to see pharmacy team.  Scheduled him to come in 9/23.  Pt appreciative for call.

## 2020-01-26 ENCOUNTER — Other Ambulatory Visit: Payer: Medicare Other

## 2020-02-03 ENCOUNTER — Other Ambulatory Visit: Payer: Self-pay

## 2020-02-03 ENCOUNTER — Ambulatory Visit (INDEPENDENT_AMBULATORY_CARE_PROVIDER_SITE_OTHER): Payer: Medicare Other | Admitting: Pharmacist

## 2020-02-03 DIAGNOSIS — I209 Angina pectoris, unspecified: Secondary | ICD-10-CM

## 2020-02-03 DIAGNOSIS — I2510 Atherosclerotic heart disease of native coronary artery without angina pectoris: Secondary | ICD-10-CM | POA: Diagnosis not present

## 2020-02-03 DIAGNOSIS — E785 Hyperlipidemia, unspecified: Secondary | ICD-10-CM | POA: Diagnosis not present

## 2020-02-03 NOTE — Progress Notes (Signed)
Patient ID: Cody Haas                 DOB: 12-27-1945                    MRN: [id number]     HPI: Cody Haas is a 74 y.o. male patient referred to lipid clinic by Dr Marlou Porch. PMH is significant for chest pain, hyperlipidemia, pre DM,  and family history of stroke.  Currently managed on rosuvastatin 20mg  daily and has been prescribed Zetia 10 mg but has not started yet.    Patient and wife have multiple concerns regarding statin therapy.  Wife did not come for appointment.  Patient complains of intermittent fatigue, intermittent rash, and intermittent muscle pain.  Is not sure if they are related to statin therapy however.  Has been taking rosuvastatin for 3 months and LDL has decreased significantly from 179 to 98.  Reports his diet is not always ideal.    Is not sure if his rash is from medications or insects since he spends half of his time in the [place].  Is also not sure if his muscle pain is from statin since he has a history of sciatica.  Neither the rash nor the muscle pain are persistent.     Current Medications: rosuvastatin 20mg , zetia 10 mg(has not started) Intolerances:  Simvastatin (muscle pain),  Risk Factors: family history, CAD, HLD LDL goal: <70  Diet: Reports he does eat too many carbohydrates  Exercise: walks dog, hunts   Labs:  TC 159, HDL 44, Trigs 93, LDL 98 (01/18/20 on crestor 20)  Past Medical History:  Diagnosis Date  . Chest pain   . Dizziness     Current Outpatient Medications on File Prior to Visit  Medication Sig Dispense Refill  . ezetimibe (ZETIA) 10 MG tablet Take 1 tablet (10 mg total) by mouth daily. 90 tablet 3  . metoprolol tartrate (LOPRESSOR) 50 MG tablet Take 1 tablet (50 mg total) by mouth once for 1 dose. Take 2 hours prior to your Coronary CT. 1 tablet 0  . Multiple Vitamin (MULTIVITAMIN) capsule Take 1 capsule by mouth daily.    . Omega-3 Fatty Acids (FISH OIL PO) Take 1 tablet by mouth daily.    . rosuvastatin (CRESTOR) 20 MG tablet  Take 1 tablet (20 mg total) by mouth daily. 90 tablet 3   No current facility-administered medications on file prior to visit.    Allergies  Allergen Reactions  . Penicillin G Swelling and Hives    Assessment/Plan:  1. Hyperlipidemia - Patient's LDL 98 which is above goal of <70 but is reducing.  Discussed possible side effects with patient of statins and of other lipid lowering medications.  Advised patient he may be able to get to goal with rosuvastatin and Zetia, but unlikely to get to goal with Zetia alone.  Discussed PCSK9i also and showed patient how to use pen.  Advised it was possible to get to goal with PCSK9 alone or in addition to statin.  Ultimately plan made to begin Zetia 10mg  and continue rosuvastatin 20 mg.  Lipid panel already scheduled for December 2021.  Continue crestor 20mg  Begin Zetia 10mg  Check lipid panel in 3 months  Karren Cobble, PharmD, BCACP, [place] [address], [place], [place] [address] Phone: [phone]; Fax: [phone] 02/03/2020 11:11 AM

## 2020-02-03 NOTE — Patient Instructions (Addendum)
It was nice meeting you!  We will continue your crestor 20 mg and start zetia 10 mg once daily  Continue exercise as tolerated and a low fat low carb diet  We will check your lab results in 2 months  Karren Cobble, PharmD, [facility] [place], [place] [address], [place], [place] [address] Phone: [phone]; Fax: [phone] 02/03/2020 10:49 AM

## 2020-03-08 DIAGNOSIS — Z23 Encounter for immunization: Secondary | ICD-10-CM | POA: Diagnosis not present

## 2020-03-16 DIAGNOSIS — D1801 Hemangioma of skin and subcutaneous tissue: Secondary | ICD-10-CM | POA: Diagnosis not present

## 2020-03-16 DIAGNOSIS — L821 Other seborrheic keratosis: Secondary | ICD-10-CM | POA: Diagnosis not present

## 2020-03-16 DIAGNOSIS — L570 Actinic keratosis: Secondary | ICD-10-CM | POA: Diagnosis not present

## 2020-03-16 DIAGNOSIS — L218 Other seborrheic dermatitis: Secondary | ICD-10-CM | POA: Diagnosis not present

## 2020-03-16 DIAGNOSIS — L853 Xerosis cutis: Secondary | ICD-10-CM | POA: Diagnosis not present

## 2020-03-16 DIAGNOSIS — L814 Other melanin hyperpigmentation: Secondary | ICD-10-CM | POA: Diagnosis not present

## 2020-03-16 DIAGNOSIS — Z85828 Personal history of other malignant neoplasm of skin: Secondary | ICD-10-CM | POA: Diagnosis not present

## 2020-03-16 DIAGNOSIS — L720 Epidermal cyst: Secondary | ICD-10-CM | POA: Diagnosis not present

## 2020-04-17 ENCOUNTER — Other Ambulatory Visit: Payer: Self-pay

## 2020-04-17 ENCOUNTER — Other Ambulatory Visit: Payer: Medicare Other | Admitting: *Deleted

## 2020-04-17 DIAGNOSIS — E785 Hyperlipidemia, unspecified: Secondary | ICD-10-CM | POA: Diagnosis not present

## 2020-04-18 DIAGNOSIS — Z79899 Other long term (current) drug therapy: Secondary | ICD-10-CM | POA: Diagnosis not present

## 2020-04-18 DIAGNOSIS — R7309 Other abnormal glucose: Secondary | ICD-10-CM | POA: Diagnosis not present

## 2020-04-18 DIAGNOSIS — Z8546 Personal history of malignant neoplasm of prostate: Secondary | ICD-10-CM | POA: Diagnosis not present

## 2020-04-18 DIAGNOSIS — Z23 Encounter for immunization: Secondary | ICD-10-CM | POA: Diagnosis not present

## 2020-04-18 DIAGNOSIS — Z0001 Encounter for general adult medical examination with abnormal findings: Secondary | ICD-10-CM | POA: Diagnosis not present

## 2020-04-18 LAB — LIPID PANEL
Chol/HDL Ratio: 4.1 ratio (ref 0.0–5.0)
Cholesterol, Total: 193 mg/dL (ref 100–199)
HDL: 47 mg/dL (ref >39)
LDL Chol Calc (NIH): 132 mg/dL — ABNORMAL HIGH (ref 0–99)
Triglycerides: 79 mg/dL (ref 0–149)
VLDL Cholesterol Cal: 14 mg/dL (ref 5–40)

## 2020-04-25 ENCOUNTER — Telehealth: Payer: Self-pay | Admitting: Cardiology

## 2020-04-25 NOTE — Telephone Encounter (Signed)
Cody Haas is calling requesting a callback with his Lipid results so he can know if he is needing to pick up his prescription the pharmacy has been calling him about. He states he will be leaving for 3 months in exactly a week and has a few questions for Dr. Marlou Porch that he would like to speak directly with him in regards to. Please advise.

## 2020-04-25 NOTE — Telephone Encounter (Signed)
Lipid panel was forwarded by Dr Marlou Porch on 12/7 to PharmD as pt has previously been seen in lipid clinic. Do not see that any documentation or follow up was completed.  LDL improved from 179 to 98 after starting rosuvastatin 20mg  daily. He was to add ezetimibe 01/20/20 however was not agreeable to this until his follow up visit with PharmD on 02/03/20. Since most recent LDL has increased to 132, anticipate that pt did not start ezetimibe and also has not been adherent with rosuvastatin. LDL goal is < 70 due to elevated calcium score of 2263. Called pt and left message to discuss.

## 2020-04-25 NOTE — Telephone Encounter (Signed)
Pt returned call to clinic. States he is taking ezetimibe and tolerating well, however he stopped rosuvastatin secondary to myalgias. States his symptoms had developed within a few weeks of statin initiation. He still has plenty of 20mg  tablets at home and is willing to try cutting them in half and taking 10mg  daily. Advised pt I will call him in a few weeks to follow up with tolerability. Can further decrease dose to 5mg  daily if needed, or try lower dose pravastatin. He is aware to continue on ezetimibe. Will plan to recheck lipid panel once pt is taking highest tolerated statin dose.

## 2020-04-25 NOTE — Telephone Encounter (Signed)
Labs sent to lipid team Will forward this messge as well./cy

## 2020-05-16 ENCOUNTER — Telehealth: Payer: Self-pay | Admitting: Pharmacist

## 2020-05-16 NOTE — Telephone Encounter (Signed)
Called pt to follow up with rosuvastatin tolerability. He reports tolerating rosuvastatin 10mg  daily better than the 20mg  dose. Myalgias are rare now. Will continue rosuvastatin 10mg  daily and ezetimibe 10mg  daily. Pt states he won't be back in [place] until April. He will call clinic then to get lipid panel scheduled.

## 2020-06-28 DIAGNOSIS — Z961 Presence of intraocular lens: Secondary | ICD-10-CM | POA: Diagnosis not present

## 2020-06-28 DIAGNOSIS — H35371 Puckering of macula, right eye: Secondary | ICD-10-CM | POA: Diagnosis not present

## 2020-06-28 DIAGNOSIS — H524 Presbyopia: Secondary | ICD-10-CM | POA: Diagnosis not present

## 2020-07-25 DIAGNOSIS — S76911A Strain of unspecified muscles, fascia and tendons at thigh level, right thigh, initial encounter: Secondary | ICD-10-CM | POA: Diagnosis not present

## 2020-07-25 DIAGNOSIS — Z719 Counseling, unspecified: Secondary | ICD-10-CM | POA: Diagnosis not present

## 2020-07-25 DIAGNOSIS — M79651 Pain in right thigh: Secondary | ICD-10-CM | POA: Diagnosis not present

## 2020-07-25 DIAGNOSIS — M5441 Lumbago with sciatica, right side: Secondary | ICD-10-CM | POA: Diagnosis not present

## 2020-08-03 DIAGNOSIS — R2689 Other abnormalities of gait and mobility: Secondary | ICD-10-CM | POA: Diagnosis not present

## 2020-08-03 DIAGNOSIS — M79651 Pain in right thigh: Secondary | ICD-10-CM | POA: Diagnosis not present

## 2020-08-03 DIAGNOSIS — S76911A Strain of unspecified muscles, fascia and tendons at thigh level, right thigh, initial encounter: Secondary | ICD-10-CM | POA: Diagnosis not present

## 2020-08-03 DIAGNOSIS — E785 Hyperlipidemia, unspecified: Secondary | ICD-10-CM | POA: Diagnosis not present

## 2020-08-03 DIAGNOSIS — Z791 Long term (current) use of non-steroidal anti-inflammatories (NSAID): Secondary | ICD-10-CM | POA: Diagnosis not present

## 2020-08-03 DIAGNOSIS — M170 Bilateral primary osteoarthritis of knee: Secondary | ICD-10-CM | POA: Diagnosis not present

## 2020-08-03 DIAGNOSIS — I2510 Atherosclerotic heart disease of native coronary artery without angina pectoris: Secondary | ICD-10-CM | POA: Diagnosis not present

## 2020-08-03 DIAGNOSIS — M5441 Lumbago with sciatica, right side: Secondary | ICD-10-CM | POA: Diagnosis not present

## 2020-08-04 DIAGNOSIS — M4316 Spondylolisthesis, lumbar region: Secondary | ICD-10-CM | POA: Diagnosis not present

## 2020-08-04 DIAGNOSIS — M5136 Other intervertebral disc degeneration, lumbar region: Secondary | ICD-10-CM | POA: Diagnosis not present

## 2020-08-04 DIAGNOSIS — M47817 Spondylosis without myelopathy or radiculopathy, lumbosacral region: Secondary | ICD-10-CM | POA: Diagnosis not present

## 2020-08-07 DIAGNOSIS — R2689 Other abnormalities of gait and mobility: Secondary | ICD-10-CM | POA: Diagnosis not present

## 2020-08-07 DIAGNOSIS — M170 Bilateral primary osteoarthritis of knee: Secondary | ICD-10-CM | POA: Diagnosis not present

## 2020-08-07 DIAGNOSIS — M79651 Pain in right thigh: Secondary | ICD-10-CM | POA: Diagnosis not present

## 2020-08-07 DIAGNOSIS — I2510 Atherosclerotic heart disease of native coronary artery without angina pectoris: Secondary | ICD-10-CM | POA: Diagnosis not present

## 2020-08-07 DIAGNOSIS — M5441 Lumbago with sciatica, right side: Secondary | ICD-10-CM | POA: Diagnosis not present

## 2020-08-07 DIAGNOSIS — S76911A Strain of unspecified muscles, fascia and tendons at thigh level, right thigh, initial encounter: Secondary | ICD-10-CM | POA: Diagnosis not present

## 2020-08-10 DIAGNOSIS — M170 Bilateral primary osteoarthritis of knee: Secondary | ICD-10-CM | POA: Diagnosis not present

## 2020-08-10 DIAGNOSIS — I2510 Atherosclerotic heart disease of native coronary artery without angina pectoris: Secondary | ICD-10-CM | POA: Diagnosis not present

## 2020-08-10 DIAGNOSIS — M79651 Pain in right thigh: Secondary | ICD-10-CM | POA: Diagnosis not present

## 2020-08-10 DIAGNOSIS — M5441 Lumbago with sciatica, right side: Secondary | ICD-10-CM | POA: Diagnosis not present

## 2020-08-10 DIAGNOSIS — S76911A Strain of unspecified muscles, fascia and tendons at thigh level, right thigh, initial encounter: Secondary | ICD-10-CM | POA: Diagnosis not present

## 2020-08-10 DIAGNOSIS — R2689 Other abnormalities of gait and mobility: Secondary | ICD-10-CM | POA: Diagnosis not present

## 2020-08-14 DIAGNOSIS — M170 Bilateral primary osteoarthritis of knee: Secondary | ICD-10-CM | POA: Diagnosis not present

## 2020-08-14 DIAGNOSIS — R2689 Other abnormalities of gait and mobility: Secondary | ICD-10-CM | POA: Diagnosis not present

## 2020-08-14 DIAGNOSIS — M79651 Pain in right thigh: Secondary | ICD-10-CM | POA: Diagnosis not present

## 2020-08-14 DIAGNOSIS — M5441 Lumbago with sciatica, right side: Secondary | ICD-10-CM | POA: Diagnosis not present

## 2020-08-14 DIAGNOSIS — I2510 Atherosclerotic heart disease of native coronary artery without angina pectoris: Secondary | ICD-10-CM | POA: Diagnosis not present

## 2020-08-14 DIAGNOSIS — S76911A Strain of unspecified muscles, fascia and tendons at thigh level, right thigh, initial encounter: Secondary | ICD-10-CM | POA: Diagnosis not present

## 2020-08-15 DIAGNOSIS — N202 Calculus of kidney with calculus of ureter: Secondary | ICD-10-CM | POA: Diagnosis not present

## 2020-08-16 ENCOUNTER — Telehealth: Payer: Self-pay | Admitting: Pharmacist

## 2020-08-16 DIAGNOSIS — E785 Hyperlipidemia, unspecified: Secondary | ICD-10-CM

## 2020-08-16 NOTE — Telephone Encounter (Signed)
Called pt to schedule f/u labs since decreasing his rosuvastatin to 10mg  daily, still on ezetimibe as well. Pt states he's still in the [place] and will be back the first week of May. Lipids scheduled for the following week.

## 2020-08-18 DIAGNOSIS — S76911A Strain of unspecified muscles, fascia and tendons at thigh level, right thigh, initial encounter: Secondary | ICD-10-CM | POA: Diagnosis not present

## 2020-08-18 DIAGNOSIS — I2510 Atherosclerotic heart disease of native coronary artery without angina pectoris: Secondary | ICD-10-CM | POA: Diagnosis not present

## 2020-08-18 DIAGNOSIS — R2689 Other abnormalities of gait and mobility: Secondary | ICD-10-CM | POA: Diagnosis not present

## 2020-08-18 DIAGNOSIS — M79651 Pain in right thigh: Secondary | ICD-10-CM | POA: Diagnosis not present

## 2020-08-18 DIAGNOSIS — M5441 Lumbago with sciatica, right side: Secondary | ICD-10-CM | POA: Diagnosis not present

## 2020-08-18 DIAGNOSIS — M170 Bilateral primary osteoarthritis of knee: Secondary | ICD-10-CM | POA: Diagnosis not present

## 2020-08-23 DIAGNOSIS — N200 Calculus of kidney: Secondary | ICD-10-CM | POA: Diagnosis not present

## 2020-08-23 DIAGNOSIS — K5730 Diverticulosis of large intestine without perforation or abscess without bleeding: Secondary | ICD-10-CM | POA: Diagnosis not present

## 2020-08-24 DIAGNOSIS — I2510 Atherosclerotic heart disease of native coronary artery without angina pectoris: Secondary | ICD-10-CM | POA: Diagnosis not present

## 2020-08-24 DIAGNOSIS — R2689 Other abnormalities of gait and mobility: Secondary | ICD-10-CM | POA: Diagnosis not present

## 2020-08-24 DIAGNOSIS — M79651 Pain in right thigh: Secondary | ICD-10-CM | POA: Diagnosis not present

## 2020-08-24 DIAGNOSIS — M5441 Lumbago with sciatica, right side: Secondary | ICD-10-CM | POA: Diagnosis not present

## 2020-08-24 DIAGNOSIS — M170 Bilateral primary osteoarthritis of knee: Secondary | ICD-10-CM | POA: Diagnosis not present

## 2020-08-24 DIAGNOSIS — S76911A Strain of unspecified muscles, fascia and tendons at thigh level, right thigh, initial encounter: Secondary | ICD-10-CM | POA: Diagnosis not present

## 2020-09-02 DIAGNOSIS — S76911A Strain of unspecified muscles, fascia and tendons at thigh level, right thigh, initial encounter: Secondary | ICD-10-CM | POA: Diagnosis not present

## 2020-09-02 DIAGNOSIS — M5441 Lumbago with sciatica, right side: Secondary | ICD-10-CM | POA: Diagnosis not present

## 2020-09-02 DIAGNOSIS — M170 Bilateral primary osteoarthritis of knee: Secondary | ICD-10-CM | POA: Diagnosis not present

## 2020-09-02 DIAGNOSIS — I2510 Atherosclerotic heart disease of native coronary artery without angina pectoris: Secondary | ICD-10-CM | POA: Diagnosis not present

## 2020-09-02 DIAGNOSIS — E785 Hyperlipidemia, unspecified: Secondary | ICD-10-CM | POA: Diagnosis not present

## 2020-09-02 DIAGNOSIS — R2689 Other abnormalities of gait and mobility: Secondary | ICD-10-CM | POA: Diagnosis not present

## 2020-09-02 DIAGNOSIS — Z791 Long term (current) use of non-steroidal anti-inflammatories (NSAID): Secondary | ICD-10-CM | POA: Diagnosis not present

## 2020-09-02 DIAGNOSIS — M79651 Pain in right thigh: Secondary | ICD-10-CM | POA: Diagnosis not present

## 2020-09-04 DIAGNOSIS — M170 Bilateral primary osteoarthritis of knee: Secondary | ICD-10-CM | POA: Diagnosis not present

## 2020-09-04 DIAGNOSIS — R2689 Other abnormalities of gait and mobility: Secondary | ICD-10-CM | POA: Diagnosis not present

## 2020-09-04 DIAGNOSIS — S76911A Strain of unspecified muscles, fascia and tendons at thigh level, right thigh, initial encounter: Secondary | ICD-10-CM | POA: Diagnosis not present

## 2020-09-04 DIAGNOSIS — I2510 Atherosclerotic heart disease of native coronary artery without angina pectoris: Secondary | ICD-10-CM | POA: Diagnosis not present

## 2020-09-04 DIAGNOSIS — M5441 Lumbago with sciatica, right side: Secondary | ICD-10-CM | POA: Diagnosis not present

## 2020-09-04 DIAGNOSIS — M79651 Pain in right thigh: Secondary | ICD-10-CM | POA: Diagnosis not present

## 2020-09-05 DIAGNOSIS — M47817 Spondylosis without myelopathy or radiculopathy, lumbosacral region: Secondary | ICD-10-CM | POA: Diagnosis not present

## 2020-09-05 DIAGNOSIS — M48061 Spinal stenosis, lumbar region without neurogenic claudication: Secondary | ICD-10-CM | POA: Diagnosis not present

## 2020-09-05 DIAGNOSIS — M4317 Spondylolisthesis, lumbosacral region: Secondary | ICD-10-CM | POA: Diagnosis not present

## 2020-09-05 DIAGNOSIS — M4316 Spondylolisthesis, lumbar region: Secondary | ICD-10-CM | POA: Diagnosis not present

## 2020-09-07 DIAGNOSIS — S76911A Strain of unspecified muscles, fascia and tendons at thigh level, right thigh, initial encounter: Secondary | ICD-10-CM | POA: Diagnosis not present

## 2020-09-07 DIAGNOSIS — M545 Low back pain, unspecified: Secondary | ICD-10-CM | POA: Diagnosis not present

## 2020-09-07 DIAGNOSIS — I2510 Atherosclerotic heart disease of native coronary artery without angina pectoris: Secondary | ICD-10-CM | POA: Diagnosis not present

## 2020-09-07 DIAGNOSIS — M47896 Other spondylosis, lumbar region: Secondary | ICD-10-CM | POA: Diagnosis not present

## 2020-09-07 DIAGNOSIS — M170 Bilateral primary osteoarthritis of knee: Secondary | ICD-10-CM | POA: Diagnosis not present

## 2020-09-07 DIAGNOSIS — M5441 Lumbago with sciatica, right side: Secondary | ICD-10-CM | POA: Diagnosis not present

## 2020-09-07 DIAGNOSIS — R2689 Other abnormalities of gait and mobility: Secondary | ICD-10-CM | POA: Diagnosis not present

## 2020-09-07 DIAGNOSIS — Z719 Counseling, unspecified: Secondary | ICD-10-CM | POA: Diagnosis not present

## 2020-09-07 DIAGNOSIS — M79651 Pain in right thigh: Secondary | ICD-10-CM | POA: Diagnosis not present

## 2020-09-13 DIAGNOSIS — L814 Other melanin hyperpigmentation: Secondary | ICD-10-CM | POA: Diagnosis not present

## 2020-09-13 DIAGNOSIS — Z85828 Personal history of other malignant neoplasm of skin: Secondary | ICD-10-CM | POA: Diagnosis not present

## 2020-09-13 DIAGNOSIS — L570 Actinic keratosis: Secondary | ICD-10-CM | POA: Diagnosis not present

## 2020-09-13 DIAGNOSIS — L218 Other seborrheic dermatitis: Secondary | ICD-10-CM | POA: Diagnosis not present

## 2020-09-13 DIAGNOSIS — L821 Other seborrheic keratosis: Secondary | ICD-10-CM | POA: Diagnosis not present

## 2020-09-18 ENCOUNTER — Other Ambulatory Visit: Payer: Medicare Other | Admitting: *Deleted

## 2020-09-18 ENCOUNTER — Other Ambulatory Visit: Payer: Self-pay

## 2020-09-18 DIAGNOSIS — E785 Hyperlipidemia, unspecified: Secondary | ICD-10-CM | POA: Diagnosis not present

## 2020-09-18 LAB — LIPID PANEL
Chol/HDL Ratio: 2.9 ratio (ref 0.0–5.0)
Cholesterol, Total: 138 mg/dL (ref 100–199)
HDL: 48 mg/dL (ref >39)
LDL Chol Calc (NIH): 75 mg/dL (ref 0–99)
Triglycerides: 75 mg/dL (ref 0–149)
VLDL Cholesterol Cal: 15 mg/dL (ref 5–40)

## 2020-09-18 LAB — HEPATIC FUNCTION PANEL
ALT: 24 IU/L (ref 0–44)
AST: 24 IU/L (ref 0–40)
Albumin: 4.6 g/dL (ref 3.7–4.7)
Alkaline Phosphatase: 40 IU/L — ABNORMAL LOW (ref 44–121)
Bilirubin Total: 0.6 mg/dL (ref 0.0–1.2)
Bilirubin, Direct: 0.16 mg/dL (ref 0.00–0.40)
Total Protein: 7 g/dL (ref 6.0–8.5)

## 2020-09-19 ENCOUNTER — Encounter: Payer: Self-pay | Admitting: *Deleted

## 2020-09-28 ENCOUNTER — Telehealth: Payer: Self-pay | Admitting: Cardiology

## 2020-09-28 DIAGNOSIS — I10 Essential (primary) hypertension: Secondary | ICD-10-CM

## 2020-09-28 DIAGNOSIS — M4310 Spondylolisthesis, site unspecified: Secondary | ICD-10-CM | POA: Diagnosis not present

## 2020-09-28 DIAGNOSIS — M4306 Spondylolysis, lumbar region: Secondary | ICD-10-CM | POA: Diagnosis not present

## 2020-09-28 DIAGNOSIS — E785 Hyperlipidemia, unspecified: Secondary | ICD-10-CM

## 2020-09-28 DIAGNOSIS — M48062 Spinal stenosis, lumbar region with neurogenic claudication: Secondary | ICD-10-CM | POA: Diagnosis not present

## 2020-09-28 NOTE — Telephone Encounter (Signed)
Pt c/o BP issue: STAT if pt c/o blurred vision, one-sided weakness or slurred speech  1. What are your last 5 BP readings? 150s/90s  85/35 150/99  2. Are you having any other symptoms (ex. Dizziness, headache, blurred vision, passed out)?NO  3. What is your BP issue? PT is calling with concerns about his elevated bp. He states that his pressure has never that high and it is very concerning.

## 2020-09-28 NOTE — Telephone Encounter (Signed)
Today at a doctor for his back BP elevated 157s/93s. After resting 135/85.  Now this afternoon 159/99.  Has never taken anything for blood pressure. Thinks Dr. Marlou Porch may want to see him sooner and will want to treat bp.  We discussed sodium, he eats out but over all feels diet is okay and does not add salt.    He will continue to monitor BP for the next several days and call back next week with readings.  He is aware if there are other recommendations we will call him back.

## 2020-09-29 NOTE — Telephone Encounter (Signed)
Please have him set up to see APP to discuss HTN. Thanks  Candee Furbish, MD

## 2020-10-02 NOTE — Telephone Encounter (Signed)
Dr Marlou Porch reviewed with Karren Cobble, [facility] who will contact the patient regarding possible need for antihypertensive and f/u.

## 2020-10-02 NOTE — Telephone Encounter (Signed)
Follow up:      Pt said he wants to talk to Dr Marlou Porch nurse, not the triage nurse.Marland Kitchen He says he needs to give her his blood pressure readings. He did not give them a at this time, he did not have them with him. He said he had been waiting so long,.

## 2020-10-02 NOTE — Telephone Encounter (Signed)
Follow up:    Patient returning a call back from last week and requesting to speak  With Nurse Redmond Pulling.

## 2020-10-03 MED ORDER — VALSARTAN 40 MG PO TABS: 40.0000 mg | ORAL_TABLET | Freq: Every day | ORAL | 1 refills | Status: DC

## 2020-10-03 MED ORDER — EZETIMIBE 10 MG PO TABS: 10.0000 mg | ORAL_TABLET | Freq: Every day | ORAL | 3 refills | Status: DC

## 2020-10-03 MED ORDER — ROSUVASTATIN CALCIUM 10 MG PO TABS: 10.0000 mg | ORAL_TABLET | Freq: Every day | ORAL | 3 refills | Status: DC

## 2020-10-03 NOTE — Telephone Encounter (Signed)
Spoke with patient.  Reports BP had been running in 150s/80s for a few days.  Had returned from [place] where he saw a provider for back pain which may be cause.  Has been taking blood pressure frequently now and will occasionally get normal readings.  Explained possible causes of elevated blood pressure (diet, lack of exercise, stress, pain, etc) and patient understood.  Is willing to start an antihypertensive but it worried he will need to be on it for life.  Explained that that was not necessarily the case.    Last lab work from [facility] showed Scr 1.09, BUN 22, GFR 66, A1c 6.1.  Since patient in pre diabetic range, will start low dose ARB and scheduled lab work for one week.    Patient also requests refills on zetia and crestor.  Currently cuts Crestor 20mg  in half since 20mg  had been giving him myalgias.  Requests 10mg  tablets so he no longer has to cut them.    Patient appreciative of call.

## 2020-10-03 NOTE — Telephone Encounter (Signed)
Patient called back. He was very frustrated at not receiving a call back from Dr. Marlou Porch nurse. Patient states he has called multiple times and has not spoken with her. Informed him a message was sent to [facility] for him to call and speak with him.

## 2020-10-10 ENCOUNTER — Other Ambulatory Visit: Payer: Medicare Other | Admitting: *Deleted

## 2020-10-10 ENCOUNTER — Other Ambulatory Visit: Payer: Self-pay

## 2020-10-10 DIAGNOSIS — I10 Essential (primary) hypertension: Secondary | ICD-10-CM | POA: Diagnosis not present

## 2020-10-10 LAB — BASIC METABOLIC PANEL
BUN/Creatinine Ratio: 18 (ref 10–24)
BUN: 18 mg/dL (ref 8–27)
CO2: 22 mmol/L (ref 20–29)
Calcium: 9.2 mg/dL (ref 8.6–10.2)
Chloride: 105 mmol/L (ref 96–106)
Creatinine, Ser: 1.02 mg/dL (ref 0.76–1.27)
Glucose: 101 mg/dL — ABNORMAL HIGH (ref 65–99)
Potassium: 4.4 mmol/L (ref 3.5–5.2)
Sodium: 141 mmol/L (ref 134–144)
eGFR: 77 mL/min/{1.73_m2} (ref >59)

## 2020-10-11 ENCOUNTER — Encounter: Payer: Self-pay | Admitting: *Deleted

## 2020-10-16 ENCOUNTER — Telehealth: Payer: Self-pay | Admitting: Pharmacist

## 2020-10-16 NOTE — Telephone Encounter (Signed)
Patient called.  Reports he lost his ezetimibe and needs to have it refilled.  Also confused if he should be on rosuvastatin or simvastatin.  Explained we do not have simvastatin on his medication list so he should be on rosuvastatin.  Date on his simvastatin bottle was from 2019.  Reports BP is controlled now on valsartan.  Had many questions regarding his tamsulosin which we did not have on his list.  Has not been taking.  Was prescribed by a urologist in [place].  Wanted to know if I thought he should be taking it.  Recommended patient call urology office for guidance.

## 2020-10-18 ENCOUNTER — Telehealth: Payer: Self-pay | Admitting: Pharmacist

## 2020-10-18 ENCOUNTER — Telehealth: Payer: Self-pay | Admitting: Cardiology

## 2020-10-18 NOTE — Telephone Encounter (Signed)
STAT if patient feels like he/she is going to faint   1) Are you dizzy now? When he stands up quickly he gets dizzy  2) Do you feel faint or have you passed out? No but believes it is possible, states it is very upsetting  3) Do you have any other symptoms? Weakness for 2 or 3 weeks.  4) Have you checked your HR and BP (record if available)? 122/80   See phone notes from 06/06 and 06/08

## 2020-10-18 NOTE — Telephone Encounter (Signed)
Spoke with patient about dizziness and weakness.  He reports becoming dizzy when bending over or getting up quickly and this started 2 days ago.  Educated on not getting up to quickly and when adjusting from laying to sitting and sitting to standing to give himself a minute or 2 before moving.  BP reading during call was 128/82, HR 94. Earlier today was 122/80.    Patient reports drinking fluids but not as much as he should.  Reports weakness over the last 2-3 weeks. Patient has an appointment on Friday, June 10 with his PCP.  Advised I would share this information with physician and nurse.  Appreciative of call.

## 2020-10-18 NOTE — Telephone Encounter (Signed)
Pt called clinic reporting dizziness when he stands up quickly. Checked his BP today and it was 122/80, has not seen any low BP readings. Advised him to take his time when standing and to stay hydrated (reports not drinking as much water lately). He'll continue to monitor his BP at home and will call with any issues. Advised he can move his valsartan to PM dosing if he would like as well.  He would also like to schedule follow up with Dr Marlou Porch, states he has called in and hasn't been able to get through to anyone. Last seen Sept 2021 with plan for 12 month follow up. Advised pt I'll forward msg to scheduling team to reach out to pt.

## 2020-10-19 NOTE — Telephone Encounter (Signed)
Reviewed this information with Dr Marlou Porch who advised if pt is still having dizziness esp if with position changes that he may hold his valsartan to see if that helps to improve his s/s.  Pt does have an appt tomorrow with his PCP however it is virtual only.  He is aware of Dr Marlou Porch recommendation to hold valsartan and continue to monitor BP.  Advised pt to call back if no improvement or if BP becomes elevated.  He states understanding.  He is leaving to go to [facility] for 3 months on 6/23.

## 2020-10-19 NOTE — Telephone Encounter (Signed)
Called pt and scheduled 1 year follow up with Dr. Marlou Porch for the first available after his due date which fell on 02/23/21. Pt states he is leaving around 11/02/20 and would like to be worked in to see a PA prior due to his dizziness that has been occurring. No available appointments with the PA's prior to September. Please advise.

## 2020-10-20 DIAGNOSIS — R42 Dizziness and giddiness: Secondary | ICD-10-CM | POA: Diagnosis not present

## 2020-10-20 DIAGNOSIS — R5383 Other fatigue: Secondary | ICD-10-CM | POA: Diagnosis not present

## 2020-10-23 DIAGNOSIS — Z23 Encounter for immunization: Secondary | ICD-10-CM | POA: Diagnosis not present

## 2020-11-30 DIAGNOSIS — N200 Calculus of kidney: Secondary | ICD-10-CM | POA: Diagnosis not present

## 2021-01-09 ENCOUNTER — Other Ambulatory Visit: Payer: Self-pay | Admitting: Cardiology

## 2021-01-09 DIAGNOSIS — Z20822 Contact with and (suspected) exposure to covid-19: Secondary | ICD-10-CM | POA: Diagnosis not present

## 2021-02-05 DIAGNOSIS — Z20822 Contact with and (suspected) exposure to covid-19: Secondary | ICD-10-CM | POA: Diagnosis not present

## 2021-02-23 ENCOUNTER — Encounter: Payer: Self-pay | Admitting: Cardiology

## 2021-02-23 ENCOUNTER — Ambulatory Visit (INDEPENDENT_AMBULATORY_CARE_PROVIDER_SITE_OTHER): Payer: Medicare Other | Admitting: Cardiology

## 2021-02-23 ENCOUNTER — Other Ambulatory Visit: Payer: Self-pay

## 2021-02-23 VITALS — BP 120/80 | HR 64 | Ht 72.0 in | Wt 204.0 lb

## 2021-02-23 DIAGNOSIS — N200 Calculus of kidney: Secondary | ICD-10-CM | POA: Insufficient documentation

## 2021-02-23 DIAGNOSIS — R030 Elevated blood-pressure reading, without diagnosis of hypertension: Secondary | ICD-10-CM | POA: Diagnosis not present

## 2021-02-23 DIAGNOSIS — E785 Hyperlipidemia, unspecified: Secondary | ICD-10-CM

## 2021-02-23 DIAGNOSIS — E782 Mixed hyperlipidemia: Secondary | ICD-10-CM | POA: Diagnosis not present

## 2021-02-23 DIAGNOSIS — Z79899 Other long term (current) drug therapy: Secondary | ICD-10-CM

## 2021-02-23 DIAGNOSIS — I2510 Atherosclerotic heart disease of native coronary artery without angina pectoris: Secondary | ICD-10-CM | POA: Diagnosis not present

## 2021-02-23 LAB — LIPID PANEL
Chol/HDL Ratio: 3.1 ratio (ref 0.0–5.0)
Cholesterol, Total: 146 mg/dL (ref 100–199)
HDL: 47 mg/dL (ref >39)
LDL Chol Calc (NIH): 84 mg/dL (ref 0–99)
Triglycerides: 77 mg/dL (ref 0–149)
VLDL Cholesterol Cal: 15 mg/dL (ref 5–40)

## 2021-02-23 NOTE — Assessment & Plan Note (Signed)
Tried valsartan 40 mg, felt terrible.  Dizzy.  Low blood pressure.  His blood pressure may have been elevated in the setting of his back pain.  Currently normal.  He is not on any antihypertensives.

## 2021-02-23 NOTE — Patient Instructions (Signed)
Medication Instructions:  The current medical regimen is effective;  continue present plan and medications.  *If you need a refill on your cardiac medications before your next appointment, please call your pharmacy*  Lab Work: Please have blood drawn today (Lipid)  If you have labs (blood work) drawn today and your tests are completely normal, you will receive your results only by: [place] (if you have MyChart) OR A paper copy in the mail If you have any lab test that is abnormal or we need to change your treatment, we will call you to review the results.  Follow-Up: At [facility], you and your health needs are our priority.  As part of our continuing mission to provide you with exceptional heart care, we have created designated Provider Care Teams.  These Care Teams include your primary Cardiologist (physician) and Advanced Practice Providers (APPs -  Physician Assistants and Nurse Practitioners) who all work together to provide you with the care you need, when you need it.  We recommend signing up for the patient portal called "MyChart".  Sign up information is provided on this After Visit Summary.  MyChart is used to connect with patients for Virtual Visits (Telemedicine).  Patients are able to view lab/test results, encounter notes, upcoming appointments, etc.  Non-urgent messages can be sent to your provider as well.   To learn more about what you can do with MyChart, go to [email or web address].    Your next appointment:   1 year(s)  The format for your next appointment:   In Person  Provider:   Candee Furbish, MD   Thank you for choosing [facility]!!

## 2021-02-23 NOTE — Assessment & Plan Note (Signed)
Highly elevated coronary calcium score however FFR was negative on CT.  [place]ney with aggressive medical management.  Continue with Crestor 10 mg as well as Zetia 10 mg.  Checking lipid panel today.  LDL previously 79, goal less than 55.  No anginal symptoms.

## 2021-02-23 NOTE — Assessment & Plan Note (Signed)
Chronic nephrolithiasis.  Conservative management.  He has seen urologist.

## 2021-02-23 NOTE — Progress Notes (Signed)
Cardiology Office Note:    Date:  02/23/2021   ID:  Cody Haas, DOB 11/25/1945, MRN [id number]  PCP:  Stephani Police, MD   [facility] Providers Cardiologist:  Candee Furbish, MD     Referring MD: Stephani Police, MD     History of Present Illness:    Cody Haas is a 75 y.o. male here for follow-up of difficult to control hypertension, dizziness at times.  Also has had chest discomfort in the past.  Coronary calcium was highly elevated at 2200 despite this vessels were large in caliber and FFR were normal on coronary CT scan with contrast.  Continuing medical management.  He has had mild chest discomfort and shoulder discomfort at times.  Thought it may have been the Crestor 20 mg.  Now on 10mg . Zetia 10mg  added as well for his protection.  We discussed the importance of high intensity statin.  Previously he was also worried about potential Lyme disease having ticks occasionally.  He has discussed this is primary care physician.  Tried valsartan 40 but felt terrible. 130/80 now. Off valsartan.  Blood pressure today in clinic 120/80.  Excellent.   Past Medical History:  Diagnosis Date   Chest pain    Dizziness     No past surgical history on file.  Current Medications: Current Meds  Medication Sig   ezetimibe (ZETIA) 10 MG tablet Take 1 tablet (10 mg total) by mouth daily.   Multiple Vitamin (MULTIVITAMIN) capsule Take 1 capsule by mouth daily.   Omega-3 Fatty Acids (FISH OIL PO) Take 1 tablet by mouth daily.   rosuvastatin (CRESTOR) 10 MG tablet Take 1 tablet (10 mg total) by mouth daily.     Allergies:   Penicillin g   Social History   Socioeconomic History   Marital status: Married    Spouse name: Not on file   Number of children: Not on file   Years of education: Not on file   Highest education level: Not on file  Occupational History   Not on file  Tobacco Use   Smoking status: Never   Smokeless tobacco: Never  Substance and Sexual Activity   Alcohol  use: No   Drug use: No   Sexual activity: Not on file  Other Topics Concern   Not on file  Social History Narrative   Not on file   Social Determinants of Health   Financial Resource Strain: Not on file  Food Insecurity: Not on file  Transportation Needs: Not on file  Physical Activity: Not on file  Stress: Not on file  Social Connections: Not on file     Family History: The patient's family history is not on file.  ROS:   Please see the history of present illness.    Denies any fevers chills nausea vomiting syncope bleeding all other systems reviewed and are negative.  EKGs/Labs/Other Studies Reviewed:    The following studies were reviewed today: Coronary CT reviewed as above  EKG:  EKG is  ordered today.  The ekg ordered today demonstrates sinus rhythm 64 no other abnormalities  Recent Labs: 09/18/2020: ALT 24 10/10/2020: BUN 18; Creatinine, Ser 1.02; Potassium 4.4; Sodium 141  Recent Lipid Panel    Component Value Date/Time   CHOL 138 09/18/2020 0818   TRIG 75 09/18/2020 0818   HDL 48 09/18/2020 0818   CHOLHDL 2.9 09/18/2020 0818   LDLCALC 75 09/18/2020 0818     Risk Assessment/Calculations:  Physical Exam:    VS:  BP 120/80 (BP Location: Left Arm, Patient Position: Sitting, Cuff Size: Normal)   Pulse 64   Ht 6' (1.829 m)   Wt 204 lb (92.5 kg)   SpO2 96%   BMI 27.67 kg/m     Wt Readings from Last 3 Encounters:  02/23/21 204 lb (92.5 kg)  01/18/20 202 lb 3.2 oz (91.7 kg)  09/24/19 204 lb 12.8 oz (92.9 kg)     GEN:  Well nourished, well developed in no acute distress HEENT: Normal NECK: No JVD; No carotid bruits LYMPHATICS: No lymphadenopathy CARDIAC: RRR, no murmurs, rubs, gallops RESPIRATORY:  Clear to auscultation without rales, wheezing or rhonchi  ABDOMEN: Soft, non-tender, non-distended MUSCULOSKELETAL:  No edema; No deformity  SKIN: Warm and dry NEUROLOGIC:  Alert and oriented x 3 PSYCHIATRIC:  Normal affect   ASSESSMENT:     1. Hyperlipidemia, unspecified hyperlipidemia type   2. Coronary artery disease involving native coronary artery of native heart without angina pectoris   3. Medication management   4. Mixed hyperlipidemia   5. Elevated blood pressure reading   6. Nephrolithiasis    PLAN:    In order of problems listed above:  CAD (coronary artery disease) Highly elevated coronary calcium score however FFR was negative on CT.  Tinney with aggressive medical management.  Continue with Crestor 10 mg as well as Zetia 10 mg.  Checking lipid panel today.  LDL previously 79, goal less than 55.  No anginal symptoms.  Mixed hyperlipidemia On Crestor 10, Zetia 10.  Checking lipids today.  Fasting.  Prior LDL 79.  Goal less than 55.  Did not seem to tolerate Crestor 20 mg well.  Elevated blood pressure reading Tried valsartan 40 mg, felt terrible.  Dizzy.  Low blood pressure.  His blood pressure may have been elevated in the setting of his back pain.  Currently normal.  He is not on any antihypertensives.  Nephrolithiasis Chronic nephrolithiasis.  Conservative management.  He has seen urologist.    1 yr   Medication Adjustments/Labs and Tests Ordered: Current medicines are reviewed at length with the patient today.  Concerns regarding medicines are outlined above.  Orders Placed This Encounter  Procedures   Lipid panel   EKG 12-Lead   No orders of the defined types were placed in this encounter.   Patient Instructions  Medication Instructions:  The current medical regimen is effective;  continue present plan and medications.  *If you need a refill on your cardiac medications before your next appointment, please call your pharmacy*  Lab Work: Please have blood drawn today (Lipid)  If you have labs (blood work) drawn today and your tests are completely normal, you will receive your results only by: [place] (if you have MyChart) OR A paper copy in the mail If you have any lab test that  is abnormal or we need to change your treatment, we will call you to review the results.  Follow-Up: At [facility], you and your health needs are our priority.  As part of our continuing mission to provide you with exceptional heart care, we have created designated Provider Care Teams.  These Care Teams include your primary Cardiologist (physician) and Advanced Practice Providers (APPs -  Physician Assistants and Nurse Practitioners) who all work together to provide you with the care you need, when you need it.  We recommend signing up for the patient portal called "MyChart".  Sign up information is provided on this After  Visit Summary.  MyChart is used to connect with patients for Virtual Visits (Telemedicine).  Patients are able to view lab/test results, encounter notes, upcoming appointments, etc.  Non-urgent messages can be sent to your provider as well.   To learn more about what you can do with MyChart, go to [email or web address].    Your next appointment:   1 year(s)  The format for your next appointment:   In Person  Provider:   Candee Furbish, MD   Thank you for choosing [facility]!!     Signed, Candee Furbish, MD  02/23/2021 9:55 AM    Cody Haas

## 2021-02-23 NOTE — Assessment & Plan Note (Signed)
On Crestor 10, Zetia 10.  Checking lipids today.  Fasting.  Prior LDL 79.  Goal less than 55.  Did not seem to tolerate Crestor 20 mg well.

## 2021-02-26 DIAGNOSIS — K449 Diaphragmatic hernia without obstruction or gangrene: Secondary | ICD-10-CM | POA: Diagnosis not present

## 2021-02-26 DIAGNOSIS — K219 Gastro-esophageal reflux disease without esophagitis: Secondary | ICD-10-CM | POA: Diagnosis not present

## 2021-02-26 DIAGNOSIS — R059 Cough, unspecified: Secondary | ICD-10-CM | POA: Diagnosis not present

## 2021-02-27 ENCOUNTER — Telehealth: Payer: Self-pay | Admitting: Cardiology

## 2021-02-27 DIAGNOSIS — E782 Mixed hyperlipidemia: Secondary | ICD-10-CM

## 2021-02-27 NOTE — Telephone Encounter (Signed)
Patient was calling in to discuss his medication options

## 2021-02-27 NOTE — Telephone Encounter (Addendum)
Spoke with pt who states he was contacted by Dr Marlou Porch nurse, Pam yesterday and advised he would need referral to pharmacy to start injectable medication for lipids.  Pt states  he refused this medication and is waiting for Dr Marlou Porch to offer another suggestion.  Pt is also asking questions as to why he needs additional medication if his LDL is WNL. Provided education re: his CAD.  Pt states he has never been told he has CAD.  Reviewed cardiac CT results and recommendations from 2021 with pt who states he does not recall this conversation but verbalizes understanding. Pt advised we are awaiting Dr Marlou Porch recommendation now since he declines discussing Repatha due to his fear of needles.  Pt verbalizes understanding and agrees with current plan.

## 2021-02-28 NOTE — Telephone Encounter (Signed)
Referral placed for lipid clinic with PharmD and message sent to [facility] for scheduling.

## 2021-02-28 NOTE — Telephone Encounter (Signed)
I would like him to talk to lipid clinic to discuss all lipid lowering options, not just Repatha for instance. Have him talk to lipid clinic to discuss options. He has repeatedly been told he has CAD/ plaque. Thanks  Candee Furbish, MD

## 2021-02-28 NOTE — Progress Notes (Addendum)
Patient ID: Cody Haas                 DOB: 1945/08/19                    MRN: [id number]     HPI: Cody Haas is a 74 y.o. male patient referred to lipid clinic by Dr. Marlou Porch. PMH is significant for transient HTN, HLD and CAD (dx 10/2019) with coronary calcium of 2263 placing him in the 92nd percentile. Coronary CT scan showed vessels large in caliber and FFR were normal on coronary CT scan with contrast.   He was seen in lipid clinic last year, currently taking max tolerated statin dose of rosuvastatin 10mg  daily + ezetimibe. LDL remains elevated at 84 above goal < 70. He reports tolerating current medications well. Prefers to avoid injections if possible.  Read up on Lipidene supplement that contains bergamot which he wants to try taking for his cholesterol. He also read about advanced lipid panels and is interested in that as well. Eating more carbs and sugar and plans to cut back on both.   Brings in his home BP cuff today as home readings have been running in the 329J systolic compared to 2 recent office visits where readings were in the 120s. Previously took low dose valsartan but reports side effect with it (doesn't recall specifics), currently on no BP meds. Home bicep cuff checked today and matching well with manual reading - 153/99 vs 150/94, higher than home and recent clinic readings.  Current Medications: rosuvastatin 10 mg daily, ezetimibe 10 mg daily Intolerances: rosuvastatin 20 mg (mild chest and shoulder discomfort.) Risk Factors: ASCVD based on coronary calcium score LDL goal: LDL <70  Diet: eating more sugars and carbs  Exercise: Walks 1-2 miles per day  Family History: family history is not on file.  Social History:  reports that he has never smoked. He has never used smokeless tobacco. He reports that he does not drink alcohol and does not use drugs.  Labs: 02/23/21: TC 146 TG 77 HDL 47 LDL 84 (rosuvastatin 10mg  daily, ezetimibe 10mg  daily)  Past Medical History:   Diagnosis Date   Chest pain    Dizziness     Current Outpatient Medications on File Prior to Visit  Medication Sig Dispense Refill   ezetimibe (ZETIA) 10 MG tablet Take 1 tablet (10 mg total) by mouth daily. 90 tablet 3   Multiple Vitamin (MULTIVITAMIN) capsule Take 1 capsule by mouth daily.     Omega-3 Fatty Acids (FISH OIL PO) Take 1 tablet by mouth daily.     rosuvastatin (CRESTOR) 10 MG tablet Take 1 tablet (10 mg total) by mouth daily. 90 tablet 3   No current facility-administered medications on file prior to visit.    Allergies  Allergen Reactions   Penicillin G Swelling and Hives    Assessment/Plan:  1. Hyperlipidemia - LDL 84 above goal < 70 due to elevated calcium score. Pt currently taking max tolerated statin dose of rosuvastatin 10mg  daily + ezetimibe 10mg  daily. Discussed Nexlizet, PCSK9i, and Leqvio today. Pt has preference for oral medications. Will submit prior authorization for Nexlizet which would replace his ezetimibe and will follow up with pt. If cost is prohibitive, he seems ok with trying Leqvio since injections are less frequent and not self-administered. Declines PCSK9i as an option. He also wishes to take his bergamot supplement for a month and focus on dietary changes before changing any lipid meds. Discussed with pt that  OTC products do not need to show efficacy or safety to be marketed and would have a preference for prescription med instead. Will check advanced lipid panel per pt request in 1 month.  Megan E. Supple, PharmD, BCACP, [place] [address] [place], [place] [address] Phone: [phone]; Fax: [phone] 03/01/2021 2:59 PM

## 2021-03-01 ENCOUNTER — Ambulatory Visit (INDEPENDENT_AMBULATORY_CARE_PROVIDER_SITE_OTHER): Payer: Medicare Other | Admitting: Pharmacist

## 2021-03-01 ENCOUNTER — Other Ambulatory Visit: Payer: Self-pay

## 2021-03-01 DIAGNOSIS — E782 Mixed hyperlipidemia: Secondary | ICD-10-CM | POA: Diagnosis not present

## 2021-03-01 DIAGNOSIS — I2510 Atherosclerotic heart disease of native coronary artery without angina pectoris: Secondary | ICD-10-CM

## 2021-03-01 NOTE — Patient Instructions (Addendum)
Your LDL is 84 and your goal is < 70  I'll send information to your insurance to see if they cover Nexlizet for your cholesterol. This is a daily pill that would replace your ezetimibe and lower your LDL by an additional 20%  If it's not covered well, I'll see if your insurance covers Leqvio. This is a subcutaneous injection given over at [facility] at month 0, 3, and then every 6 months. It lowers your LDL cholesterol by 50% and would also replace your ezetimibe  You can try Pepcid or Tums as needed for acid reflux  Recheck NMR advanced lipid panel on Tuesday, November 29th. Come in fasting any time after 7:30am

## 2021-03-05 ENCOUNTER — Telehealth: Payer: Self-pay | Admitting: Pharmacist

## 2021-03-05 NOTE — Telephone Encounter (Addendum)
Nexlizet prior authorization denied, insurance wants pt to try either atorvastatin, lovastatin, or pravastatin. Appeals has been faxed, doesn't make any sense clinically to force pt to change to a less effective/tolerated statin when he's already taking rosuvastatin at his max tolerated dose.  Leqvio benefit investigation resulted as well - pt will have $0 copay, just has to pay through annual $233 Medicare Part B deductible.  Will call pt once Nexlizet appeals has been overturned as he has a preference for oral vs injectable meds.

## 2021-03-05 NOTE — Telephone Encounter (Signed)
Pt called requesting update, provided with info below.

## 2021-03-12 NOTE — Telephone Encounter (Signed)
Called for status update on Nexlizet appeals and was advised that there is no contact # for appeals dept or way to check a status update other than just faxing them again. 2nd copy of appeals has been faxed to [employer].

## 2021-03-14 DIAGNOSIS — N3001 Acute cystitis with hematuria: Secondary | ICD-10-CM | POA: Diagnosis not present

## 2021-03-14 NOTE — Telephone Encounter (Signed)
Nexlizet appeals over turned. Approved through 05/12/22. Per OV note  "He also wishes to take his bergamot supplement for a month and focus on dietary changes before changing any lipid meds. Discussed with pt that OTC products do not need to show efficacy or safety to be marketed and would have a preference for prescription med instead. Will check advanced lipid panel per pt request in 1 month"  Therefore I will hold off sending rx. Will send message to Megan supple so she knows it is approved.

## 2021-03-16 DIAGNOSIS — Z23 Encounter for immunization: Secondary | ICD-10-CM | POA: Diagnosis not present

## 2021-03-19 DIAGNOSIS — D225 Melanocytic nevi of trunk: Secondary | ICD-10-CM | POA: Diagnosis not present

## 2021-03-19 DIAGNOSIS — D2239 Melanocytic nevi of other parts of face: Secondary | ICD-10-CM | POA: Diagnosis not present

## 2021-03-19 DIAGNOSIS — L570 Actinic keratosis: Secondary | ICD-10-CM | POA: Diagnosis not present

## 2021-03-19 DIAGNOSIS — L918 Other hypertrophic disorders of the skin: Secondary | ICD-10-CM | POA: Diagnosis not present

## 2021-03-19 DIAGNOSIS — L814 Other melanin hyperpigmentation: Secondary | ICD-10-CM | POA: Diagnosis not present

## 2021-03-19 DIAGNOSIS — L821 Other seborrheic keratosis: Secondary | ICD-10-CM | POA: Diagnosis not present

## 2021-03-19 DIAGNOSIS — Z85828 Personal history of other malignant neoplasm of skin: Secondary | ICD-10-CM | POA: Diagnosis not present

## 2021-03-19 DIAGNOSIS — D1801 Hemangioma of skin and subcutaneous tissue: Secondary | ICD-10-CM | POA: Diagnosis not present

## 2021-03-19 DIAGNOSIS — I788 Other diseases of capillaries: Secondary | ICD-10-CM | POA: Diagnosis not present

## 2021-03-19 MED ORDER — NEXLIZET 180-10 MG PO TABS: 1.0000 | ORAL_TABLET | Freq: Every day | ORAL | 11 refills | Status: DC

## 2021-03-19 NOTE — Telephone Encounter (Addendum)
Called pt, he is agreeable to change from ezetimibe to Nexlizet now. Will continue on rosuvastatin 10mg  daily. Moved out f/u labs to mid December before pt goes to [place] for the winter. If copay ends up being cost prohibitive, would revisit Leqvio to see if pt agreeable (previously had preference for oral option) - he does have a supplement and Leqvio would be covered in full aside from the annual $233 Part B deductible.

## 2021-03-19 NOTE — Addendum Note (Signed)
Addended by: Marq Rebello E on: 03/19/2021 11:11 AM   Modules accepted: Orders

## 2021-04-10 ENCOUNTER — Other Ambulatory Visit: Payer: Medicare Other

## 2021-04-12 DIAGNOSIS — K449 Diaphragmatic hernia without obstruction or gangrene: Secondary | ICD-10-CM | POA: Diagnosis not present

## 2021-04-12 DIAGNOSIS — R12 Heartburn: Secondary | ICD-10-CM | POA: Diagnosis not present

## 2021-04-12 DIAGNOSIS — K219 Gastro-esophageal reflux disease without esophagitis: Secondary | ICD-10-CM | POA: Diagnosis not present

## 2021-04-12 DIAGNOSIS — K2289 Other specified disease of esophagus: Secondary | ICD-10-CM | POA: Diagnosis not present

## 2021-04-12 DIAGNOSIS — K2270 Barrett's esophagus without dysplasia: Secondary | ICD-10-CM | POA: Diagnosis not present

## 2021-04-23 ENCOUNTER — Other Ambulatory Visit: Payer: Self-pay

## 2021-04-23 ENCOUNTER — Other Ambulatory Visit: Payer: Medicare Other | Admitting: *Deleted

## 2021-04-23 DIAGNOSIS — I2510 Atherosclerotic heart disease of native coronary artery without angina pectoris: Secondary | ICD-10-CM | POA: Diagnosis not present

## 2021-04-24 LAB — LIPOPROTEIN A (LPA): Lipoprotein (a): 129.9 nmol/L — ABNORMAL HIGH (ref <75.0)

## 2021-04-24 LAB — NMR, LIPOPROFILE
Cholesterol, Total: 122 mg/dL (ref 100–199)
HDL Particle Number: 35.5 umol/L (ref >=30.5)
HDL-C: 45 mg/dL (ref >39)
LDL Particle Number: 806 nmol/L (ref <1000)
LDL Size: 20.4 nm — ABNORMAL LOW (ref >20.5)
LDL-C (NIH Calc): 60 mg/dL (ref 0–99)
LP-IR Score: 52 — ABNORMAL HIGH (ref <=45)
Small LDL Particle Number: 493 nmol/L (ref <=527)
Triglycerides: 87 mg/dL (ref 0–149)

## 2021-04-24 LAB — APOLIPOPROTEIN B: Apolipoprotein B: 62 mg/dL (ref <90)

## 2021-04-25 ENCOUNTER — Telehealth: Payer: Self-pay | Admitting: Pharmacist

## 2021-04-25 NOTE — Telephone Encounter (Signed)
LDL improved from 85 to 60 and is now at goal < 70 after changing ezetimibe to Nexlizet. LDL particle # slightly elevated above goal < 700. 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 Pt currently taking max tolerated statin dose of rosuvastatin 10mg  daily in addition to Nexlizet. He does not want to take PCSK9i. Was open to [facility] at his last visit but is heading down to [place] for the next 4 months so logistically would be difficult. Pt will continue on current meds and was advised to work on lifestyle including increasing activity and decreasing saturated fats. He'll let us know when he's back in town - can recheck cholesterol at that time.

## 2021-06-07 ENCOUNTER — Telehealth: Payer: Self-pay | Admitting: Cardiology

## 2021-06-07 NOTE — Telephone Encounter (Signed)
Patient would like to speak to [facility] about a medication he is taking.

## 2021-06-07 NOTE — Telephone Encounter (Signed)
Returned call to pt. States he was charged over $400 for 1 month of Nexlizet. We don't have his Part D insurance scanned in but he reports having the [place] which has a $505 deductible. Discussed with pt that's why his copay was high and that on next fill it will drop. He lives in [place] for half the year and won't be back in [place] until April. Previously discussed Leqvio with pt, he has a supplement plan and this is covered in full. He asks if he can get this in [place], advised him he would need an MD in [facility] to prescribe and manage the med. He would like to try Leqvio once he's back in [place] and will call me then to discuss.

## 2021-07-11 DIAGNOSIS — E785 Hyperlipidemia, unspecified: Secondary | ICD-10-CM | POA: Diagnosis not present

## 2021-07-11 DIAGNOSIS — I10 Essential (primary) hypertension: Secondary | ICD-10-CM | POA: Diagnosis not present

## 2021-07-17 DIAGNOSIS — H524 Presbyopia: Secondary | ICD-10-CM | POA: Diagnosis not present

## 2021-07-17 DIAGNOSIS — Z961 Presence of intraocular lens: Secondary | ICD-10-CM | POA: Diagnosis not present

## 2021-07-17 DIAGNOSIS — H35371 Puckering of macula, right eye: Secondary | ICD-10-CM | POA: Diagnosis not present

## 2021-07-19 DIAGNOSIS — I10 Essential (primary) hypertension: Secondary | ICD-10-CM | POA: Diagnosis not present

## 2021-09-07 ENCOUNTER — Telehealth: Payer: Self-pay | Admitting: Pharmacist

## 2021-09-07 DIAGNOSIS — E782 Mixed hyperlipidemia: Secondary | ICD-10-CM

## 2021-09-07 NOTE — Telephone Encounter (Signed)
Pt called clinic, reports he did get a cardiologist in [place] (lives there half the year). They put him on amlodipine '5mg'$  daily for elevated BP, med list has been updated. ? ?Now back in [place] and wishes to have cholesterol rechecked along with his glucose and kidney function. Still taking Nexlizet and rosuvastatin daily. Labs added for next week. ? ?

## 2021-09-11 ENCOUNTER — Other Ambulatory Visit: Payer: Medicare Other | Admitting: *Deleted

## 2021-09-11 DIAGNOSIS — E782 Mixed hyperlipidemia: Secondary | ICD-10-CM

## 2021-09-11 LAB — COMPREHENSIVE METABOLIC PANEL
ALT: 28 IU/L (ref 0–44)
AST: 31 IU/L (ref 0–40)
Albumin/Globulin Ratio: 2 (ref 1.2–2.2)
Albumin: 4.4 g/dL (ref 3.7–4.7)
Alkaline Phosphatase: 32 IU/L — ABNORMAL LOW (ref 44–121)
BUN/Creatinine Ratio: 13 (ref 10–24)
BUN: 14 mg/dL (ref 8–27)
Bilirubin Total: 0.5 mg/dL (ref 0.0–1.2)
CO2: 25 mmol/L (ref 20–29)
Calcium: 9.2 mg/dL (ref 8.6–10.2)
Chloride: 106 mmol/L (ref 96–106)
Creatinine, Ser: 1.05 mg/dL (ref 0.76–1.27)
Globulin, Total: 2.2 g/dL (ref 1.5–4.5)
Glucose: 113 mg/dL — ABNORMAL HIGH (ref 70–99)
Potassium: 4.4 mmol/L (ref 3.5–5.2)
Sodium: 141 mmol/L (ref 134–144)
Total Protein: 6.6 g/dL (ref 6.0–8.5)
eGFR: 74 mL/min/{1.73_m2} (ref >59)

## 2021-09-11 LAB — LIPID PANEL
Chol/HDL Ratio: 2.5 ratio (ref 0.0–5.0)
Cholesterol, Total: 103 mg/dL (ref 100–199)
HDL: 41 mg/dL (ref >39)
LDL Chol Calc (NIH): 47 mg/dL (ref 0–99)
Triglycerides: 72 mg/dL (ref 0–149)
VLDL Cholesterol Cal: 15 mg/dL (ref 5–40)

## 2021-09-17 DIAGNOSIS — L570 Actinic keratosis: Secondary | ICD-10-CM | POA: Diagnosis not present

## 2021-09-17 DIAGNOSIS — D1801 Hemangioma of skin and subcutaneous tissue: Secondary | ICD-10-CM | POA: Diagnosis not present

## 2021-09-17 DIAGNOSIS — L814 Other melanin hyperpigmentation: Secondary | ICD-10-CM | POA: Diagnosis not present

## 2021-09-17 DIAGNOSIS — L820 Inflamed seborrheic keratosis: Secondary | ICD-10-CM | POA: Diagnosis not present

## 2021-09-17 DIAGNOSIS — Z85828 Personal history of other malignant neoplasm of skin: Secondary | ICD-10-CM | POA: Diagnosis not present

## 2021-09-17 DIAGNOSIS — L918 Other hypertrophic disorders of the skin: Secondary | ICD-10-CM | POA: Diagnosis not present

## 2021-09-17 DIAGNOSIS — L821 Other seborrheic keratosis: Secondary | ICD-10-CM | POA: Diagnosis not present

## 2021-10-10 DIAGNOSIS — I10 Essential (primary) hypertension: Secondary | ICD-10-CM | POA: Diagnosis not present

## 2021-10-17 ENCOUNTER — Other Ambulatory Visit: Payer: Self-pay | Admitting: Cardiology

## 2021-10-17 DIAGNOSIS — E785 Hyperlipidemia, unspecified: Secondary | ICD-10-CM

## 2021-10-17 DIAGNOSIS — I10 Essential (primary) hypertension: Secondary | ICD-10-CM | POA: Diagnosis not present

## 2021-12-11 DIAGNOSIS — N200 Calculus of kidney: Secondary | ICD-10-CM | POA: Diagnosis not present

## 2021-12-19 DIAGNOSIS — C61 Malignant neoplasm of prostate: Secondary | ICD-10-CM | POA: Diagnosis not present

## 2021-12-19 DIAGNOSIS — N200 Calculus of kidney: Secondary | ICD-10-CM | POA: Diagnosis not present

## 2021-12-24 DIAGNOSIS — C61 Malignant neoplasm of prostate: Secondary | ICD-10-CM | POA: Diagnosis not present

## 2021-12-24 DIAGNOSIS — N200 Calculus of kidney: Secondary | ICD-10-CM | POA: Diagnosis not present

## 2022-01-19 ENCOUNTER — Other Ambulatory Visit: Payer: Self-pay | Admitting: Cardiology

## 2022-01-19 DIAGNOSIS — E785 Hyperlipidemia, unspecified: Secondary | ICD-10-CM

## 2022-01-21 ENCOUNTER — Other Ambulatory Visit: Payer: Self-pay | Admitting: Family Medicine

## 2022-01-21 ENCOUNTER — Ambulatory Visit
Admission: RE | Admit: 2022-01-21 | Discharge: 2022-01-21 | Disposition: A | Discharge status: Home / Self Care | Payer: Medicare Other | Source: Ambulatory Visit | Attending: Family Medicine | Admitting: Family Medicine

## 2022-01-21 DIAGNOSIS — R0602 Shortness of breath: Secondary | ICD-10-CM

## 2022-01-21 DIAGNOSIS — R059 Cough, unspecified: Secondary | ICD-10-CM | POA: Diagnosis not present

## 2022-01-23 ENCOUNTER — Telehealth: Payer: Self-pay | Admitting: Cardiology

## 2022-01-23 NOTE — Telephone Encounter (Signed)
Spoke with pt who complains of increasing fatigue, SOB on exertion and dizziness when standing too quickly.  Pt states he saw his PCP yesterday who recommends pt follow up with cardiology.   Pt states he can hardly do any type of activity due to fatigue or SOB, especially climbing stairs.  He also complains of dizziness with position change.  Pt denies current CP.  He does not have current BP readings. Appointment scheduled with Dr Marlou Porch 01/28/2022.  Discussed changing positions slowly.  Pt advised if new symptoms develop or current symptoms increase he should seek care in ED.  Pt verbalizes understanding and agrees with current plan.

## 2022-01-23 NOTE — Telephone Encounter (Signed)
STAT if patient feels like he/she is going to faint   Are you dizzy now? no  Do you feel faint or have you passed out? yes  Do you have any other symptoms? Sob, fatigue  Have you checked your HR and BP (record if available)?    [facility] patient on 9/18 w/ Marlou Porch

## 2022-01-28 ENCOUNTER — Ambulatory Visit: Payer: Medicare Other | Attending: Cardiology | Admitting: Cardiology

## 2022-01-28 ENCOUNTER — Encounter: Payer: Self-pay | Admitting: Cardiology

## 2022-01-28 VITALS — BP 120/80 | HR 65 | Ht 72.0 in | Wt 198.0 lb

## 2022-01-28 DIAGNOSIS — R0602 Shortness of breath: Secondary | ICD-10-CM | POA: Diagnosis not present

## 2022-01-28 DIAGNOSIS — I2583 Coronary atherosclerosis due to lipid rich plaque: Secondary | ICD-10-CM | POA: Insufficient documentation

## 2022-01-28 DIAGNOSIS — I2510 Atherosclerotic heart disease of native coronary artery without angina pectoris: Secondary | ICD-10-CM | POA: Diagnosis not present

## 2022-01-28 DIAGNOSIS — Z01812 Encounter for preprocedural laboratory examination: Secondary | ICD-10-CM | POA: Insufficient documentation

## 2022-01-28 LAB — BASIC METABOLIC PANEL
BUN/Creatinine Ratio: 21 (ref 10–24)
BUN: 24 mg/dL (ref 8–27)
CO2: 27 mmol/L (ref 20–29)
Calcium: 9.2 mg/dL (ref 8.6–10.2)
Chloride: 102 mmol/L (ref 96–106)
Creatinine, Ser: 1.12 mg/dL (ref 0.76–1.27)
Glucose: 114 mg/dL — ABNORMAL HIGH (ref 70–99)
Potassium: 4.5 mmol/L (ref 3.5–5.2)
Sodium: 139 mmol/L (ref 134–144)
eGFR: 68 mL/min/{1.73_m2} (ref >59)

## 2022-01-28 LAB — CBC
Hematocrit: 38.7 % (ref 37.5–51.0)
Hemoglobin: 13.2 g/dL (ref 13.0–17.7)
MCH: 32.5 pg (ref 26.6–33.0)
MCHC: 34.1 g/dL (ref 31.5–35.7)
MCV: 95 fL (ref 79–97)
Platelets: 283 10*3/uL (ref 150–450)
RBC: 4.06 x10E6/uL — ABNORMAL LOW (ref 4.14–5.80)
RDW: 12.7 % (ref 11.6–15.4)
WBC: 8.4 10*3/uL (ref 3.4–10.8)

## 2022-01-28 MED ORDER — ASPIRIN 81 MG PO TBEC: 81.0000 mg | DELAYED_RELEASE_TABLET | Freq: Every day | ORAL | 12 refills | Status: AC

## 2022-01-28 MED ORDER — AMLODIPINE BESYLATE 2.5 MG PO TABS: 2.5000 mg | ORAL_TABLET | Freq: Every day | ORAL | 3 refills | Status: DC

## 2022-01-28 NOTE — H&P (View-Only) (Signed)
Cardiology Office Note:    Date:  01/28/2022   ID:  Cody Haas, DOB 15-Feb-1946, MRN [id number]  PCP:  Stephani Police, MD   [facility] Providers Cardiologist:  Candee Furbish, MD     Referring MD: Stephani Police, MD     History of Present Illness:    Cody Haas is a 76 y.o. male here for follow-up of difficult to control hypertension, dizziness at times.  Complains of increasing fatigue, SOB on exertion and dizziness when standing too quickly.  Pt states he saw his PCP yesterday who recommends pt follow up with cardiology.  Newest addition was amlodipine. See MD in [place] as well. Has cardiologist there too.   Has had cough for 2 weeks.  Chest x-ray unremarkable.  Pt states he can hardly do any type of activity due to fatigue or SOB, especially climbing stairs.  He also complains of dizziness with position change.  Pt denies current CP.  He does not have current BP readings.  Also has had chest discomfort in the past.  Coronary calcium was highly elevated at 2200 despite this vessels were large in caliber and FFR were non flow limiting on coronary CT scan with contrast.  Continuing medical management.    He has had mild chest discomfort and shoulder discomfort at times.  Thought it may have been the Crestor 20 mg.  Now on '10mg'$ . Bempedoic Acid and Zetia '10mg'$  (Nexlizet) added as well for his protection by lipid clinic.  We discussed the importance of high intensity statin.  Previously he was also worried about potential Lyme disease having ticks occasionally.  He has discussed this is primary care physician.  Cut a tree down, rash on forearms.  Has prednisone to take.  His wife assisted with historical items.  Tried valsartan 40 but felt terrible.  Off valsartan.    Past Medical History:  Diagnosis Date   Chest pain    Dizziness     No past surgical history on file.  Current Medications: Current Meds  Medication Sig   amLODipine (NORVASC) 2.5 MG tablet Take 1 tablet  (2.5 mg total) by mouth daily.   aspirin EC 81 MG tablet Take 1 tablet (81 mg total) by mouth daily. Swallow whole.   Bempedoic Acid-Ezetimibe (NEXLIZET) 180-10 MG TABS Take 1 tablet by mouth daily.   Coenzyme Q10 10 MG capsule Take 10 mg by mouth daily.   hydrochlorothiazide (HYDRODIURIL) 12.5 MG tablet Take 12.5 mg by mouth daily.   Multiple Vitamin (MULTIVITAMIN) capsule Take 1 capsule by mouth daily.   omeprazole (PRILOSEC) 40 MG capsule Take 40 mg by mouth in the morning and at bedtime.   rosuvastatin (CRESTOR) 10 MG tablet TAKE 1 TABLET(10 MG) BY MOUTH DAILY   [DISCONTINUED] amLODipine (NORVASC) 5 MG tablet Take 5 mg by mouth daily.     Allergies:   Penicillin g   Social History   Socioeconomic History   Marital status: Married    Spouse name: Not on file   Number of children: Not on file   Years of education: Not on file   Highest education level: Not on file  Occupational History   Not on file  Tobacco Use   Smoking status: Never   Smokeless tobacco: Never  Substance and Sexual Activity   Alcohol use: No   Drug use: No   Sexual activity: Not on file  Other Topics Concern   Not on file  Social History Narrative   Not on file   Social  Determinants of Health   Financial Resource Strain: Not on file  Food Insecurity: Not on file  Transportation Needs: Not on file  Physical Activity: Not on file  Stress: Not on file  Social Connections: Not on file     Family History: The patient's family history is not on file.  ROS:   Please see the history of present illness.    Denies any fevers chills nausea vomiting syncope bleeding all other systems reviewed and are negative.  EKGs/Labs/Other Studies Reviewed:    The following studies were reviewed today: Coronary CT reviewed as above  EKG:  EKG is  ordered today.  The ekg ordered today demonstrates sinus rhythm 64 no other abnormalities  Recent Labs: 09/11/2021: ALT 28; BUN 14; Creatinine, Ser 1.05; Potassium 4.4;  Sodium 141  Recent Lipid Panel    Component Value Date/Time   CHOL 103 09/11/2021 0842   TRIG 72 09/11/2021 0842   HDL 41 09/11/2021 0842   CHOLHDL 2.5 09/11/2021 0842   LDLCALC 47 09/11/2021 0842     Risk Assessment/Calculations:           Physical Exam:    VS:  BP 120/80 (BP Location: Left Arm, Patient Position: Sitting, Cuff Size: Normal)   Pulse 65   Ht 6' (1.829 m)   Wt 198 lb (89.8 kg)   SpO2 94%   BMI 26.85 kg/m     Wt Readings from Last 3 Encounters:  01/28/22 198 lb (89.8 kg)  02/23/21 204 lb (92.5 kg)  01/18/20 202 lb 3.2 oz (91.7 kg)     GEN: Well nourished, well developed, in no acute distress HEENT: normal Neck: no JVD, carotid bruits, or masses Cardiac: RRR; no murmurs, rubs, or gallops,no edema  Respiratory:  clear to auscultation bilaterally, normal work of breathing GI: soft, nontender, nondistended, + BS MS: no deformity or atrophy Skin: warm and dry, mild forearm rash noted after chopping oak tree Neuro:  Alert and Oriented x 3, Strength and sensation are intact Psych: euthymic mood, full affect   ASSESSMENT:    1. Pre-procedure lab exam   2. SOB (shortness of breath)   3. Coronary artery disease due to lipid rich plaque     PLAN:    In order of problems listed above:  Dyspnea and dizziness --will check ECHO to ensure proper function, dyspnea could be anginal equivalent.  He has trouble getting up 1 flight of stairs without significant shortness of breath.  He may have a degree of heart failure. --We will also check a left heart catheterization given his symptoms.  He does have severe underlying coronary artery disease/calcification however prior CT scan did not show any evidence of FFR flow abnormalities 2 years ago.  We will go ahead and check a heart catheterization to ensure that there is no significant obstructive disease.   Coronary artery disease Hyperlipidemia --CT 2021 - Ca score 2000's but FFR with no flow limiting  disease-checking heart catheterization for verification.  There could have been advancement of disease. --LDL 47 on meds as above   Hypertension -Has been excellently controlled with Norvasc 5 mg, hydrochlorothiazide 12.5 mg daily.  Appreciate the assistance of the hypertension clinic.  Could he have a degree of orthostatics.  Checking today.  119/76, 61 laying down, 120/79, 68 sitting, 109/7574 mild dizziness standing, 112/74, 71 mild dizziness. -Because of this, we will decrease his amlodipine to 2.5 mg once a day.  Continue to monitor symptoms.     Nephrolithiasis --Chronic nephrolithiasis.  Conservative management.  He has seen urologist  1 month with APP.   Medication Adjustments/Labs and Tests Ordered: Current medicines are reviewed at length with the patient today.  Concerns regarding medicines are outlined above.  Orders Placed This Encounter  Procedures   CBC   Basic metabolic panel   EKG 14-NWGN   ECHOCARDIOGRAM COMPLETE   Meds ordered this encounter  Medications   aspirin EC 81 MG tablet    Sig: Take 1 tablet (81 mg total) by mouth daily. Swallow whole.    Dispense:  30 tablet    Refill:  12   amLODipine (NORVASC) 2.5 MG tablet    Sig: Take 1 tablet (2.5 mg total) by mouth daily.    Dispense:  90 tablet    Refill:  3     Patient Instructions  Medication Instructions:  Please start Aspirin 81 mg a day. Decrease Amlodipine to 2.5 mg a day. Continue all other medications as listed.  *If you need a refill on your cardiac medications before your next appointment, please call your pharmacy*  Lab Work: Please have blood work today.  (CBC. BMP)  If you have labs (blood work) drawn today and your tests are completely normal, you will receive your results only by: MyChart Message (if you have MyChart) OR A paper copy in the mail If you have any lab test that is abnormal or we need to change your treatment, we will call you to review the  results.  Testing/Procedures: Your physician has requested that you have an echocardiogram. Echocardiography is a painless test that uses sound waves to create images of your heart. It provides your doctor with information about the size and shape of your heart and how well your heart's chambers and valves are working. This procedure takes approximately one hour. There are no restrictions for this procedure.     Cardiac/Peripheral Catheterization   You are scheduled for a Cardiac Catheterization on Friday, September 22 with Dr. Lauree Chandler.  1. Please arrive at the Main Entrance A at [facility]: [place], [place] [address] on September 22 at 5:30 AM (This time is two hours before your procedure to ensure your preparation). Free valet parking service is available. You will check in at ADMITTING. The support person will be asked to wait in the waiting room.  It is OK to have someone drop you off and come back when you are ready to be discharged.        Special note: Every effort is made to have your procedure done on time. Please understand that emergencies sometimes delay scheduled procedures.   . 2. Diet: Do not eat solid foods after midnight.  You may have clear liquids until 5 AM the day of the procedure.  3. Labs: You will need to have blood drawn today.  4. Medication instructions in preparation for your procedure:   Contrast Allergy: No  On the morning of your procedure, take Aspirin 81 mg and any morning medicines NOT listed above.  You may use sips of water. Hold Hydrochlorothiazide this morning.  5. Plan to go home the same day, you will only stay overnight if medically necessary. 6. You MUST have a responsible adult to drive you home. 7. An adult MUST be with you the first 24 hours after you arrive home. 8. Bring a current list of your medications, and the last time and date medication taken. 9. Bring ID and current insurance cards. 10.Please  wear clothes  that are easy to get on and off and wear slip-on shoes.  Thank you for allowing [place] to care for you!   -- [place] [facility]  Follow-Up: At [facility], you and your health needs are our priority.  As part of our continuing mission to provide you with exceptional heart care, we have created designated Provider Care Teams.  These Care Teams include your primary Cardiologist (physician) and Advanced Practice Providers (APPs -  Physician Assistants and Nurse Practitioners) who all work together to provide you with the care you need, when you need it.  We recommend signing up for the patient portal called "MyChart".  Sign up information is provided on this After Visit Summary.  MyChart is used to connect with patients for Virtual Visits (Telemedicine).  Patients are able to view lab/test results, encounter notes, upcoming appointments, etc.  Non-urgent messages can be sent to your provider as well.   To learn more about what you can do with MyChart, go to [email or web address].    Your next appointment:   1 month(s)  The format for your next appointment:   In Person  Provider:   Robbie Lis, PA-C, Nicholes Rough, PA-C, Ambrose Pancoast, NP, Ermalinda Barrios, PA-C, Christen Bame, NP, or Richardson Dopp, PA-C         Important Information About Sugar         Signed, Candee Furbish, MD  01/28/2022 9:26 AM    [place]

## 2022-01-28 NOTE — Progress Notes (Signed)
Cardiology Office Note:    Date:  01/28/2022   ID:  Cody Haas, DOB 17-Jun-1945, MRN [id number]  PCP:  Stephani Police, MD   [facility] Providers Cardiologist:  Candee Furbish, MD     Referring MD: Stephani Police, MD     History of Present Illness:    Cody Haas is a 76 y.o. male here for follow-up of difficult to control hypertension, dizziness at times.  Complains of increasing fatigue, SOB on exertion and dizziness when standing too quickly.  Pt states he saw his PCP yesterday who recommends pt follow up with cardiology.  Newest addition was amlodipine. See MD in [place] as well. Has cardiologist there too.   Has had cough for 2 weeks.  Chest x-ray unremarkable.  Pt states he can hardly do any type of activity due to fatigue or SOB, especially climbing stairs.  He also complains of dizziness with position change.  Pt denies current CP.  He does not have current BP readings.  Also has had chest discomfort in the past.  Coronary calcium was highly elevated at 2200 despite this vessels were large in caliber and FFR were non flow limiting on coronary CT scan with contrast.  Continuing medical management.    He has had mild chest discomfort and shoulder discomfort at times.  Thought it may have been the Crestor 20 mg.  Now on '10mg'$ . Bempedoic Acid and Zetia '10mg'$  (Nexlizet) added as well for his protection by lipid clinic.  We discussed the importance of high intensity statin.  Previously he was also worried about potential Lyme disease having ticks occasionally.  He has discussed this is primary care physician.  Cut a tree down, rash on forearms.  Has prednisone to take.  His wife assisted with historical items.  Tried valsartan 40 but felt terrible.  Off valsartan.    Past Medical History:  Diagnosis Date   Chest pain    Dizziness     No past surgical history on file.  Current Medications: Current Meds  Medication Sig   amLODipine (NORVASC) 2.5 MG tablet Take 1 tablet  (2.5 mg total) by mouth daily.   aspirin EC 81 MG tablet Take 1 tablet (81 mg total) by mouth daily. Swallow whole.   Bempedoic Acid-Ezetimibe (NEXLIZET) 180-10 MG TABS Take 1 tablet by mouth daily.   Coenzyme Q10 10 MG capsule Take 10 mg by mouth daily.   hydrochlorothiazide (HYDRODIURIL) 12.5 MG tablet Take 12.5 mg by mouth daily.   Multiple Vitamin (MULTIVITAMIN) capsule Take 1 capsule by mouth daily.   omeprazole (PRILOSEC) 40 MG capsule Take 40 mg by mouth in the morning and at bedtime.   rosuvastatin (CRESTOR) 10 MG tablet TAKE 1 TABLET(10 MG) BY MOUTH DAILY   [DISCONTINUED] amLODipine (NORVASC) 5 MG tablet Take 5 mg by mouth daily.     Allergies:   Penicillin g   Social History   Socioeconomic History   Marital status: Married    Spouse name: Not on file   Number of children: Not on file   Years of education: Not on file   Highest education level: Not on file  Occupational History   Not on file  Tobacco Use   Smoking status: Never   Smokeless tobacco: Never  Substance and Sexual Activity   Alcohol use: No   Drug use: No   Sexual activity: Not on file  Other Topics Concern   Not on file  Social History Narrative   Not on file   Social  Determinants of Health   Financial Resource Strain: Not on file  Food Insecurity: Not on file  Transportation Needs: Not on file  Physical Activity: Not on file  Stress: Not on file  Social Connections: Not on file     Family History: The patient's family history is not on file.  ROS:   Please see the history of present illness.    Denies any fevers chills nausea vomiting syncope bleeding all other systems reviewed and are negative.  EKGs/Labs/Other Studies Reviewed:    The following studies were reviewed today: Coronary CT reviewed as above  EKG:  EKG is  ordered today.  The ekg ordered today demonstrates sinus rhythm 64 no other abnormalities  Recent Labs: 09/11/2021: ALT 28; BUN 14; Creatinine, Ser 1.05; Potassium 4.4;  Sodium 141  Recent Lipid Panel    Component Value Date/Time   CHOL 103 09/11/2021 0842   TRIG 72 09/11/2021 0842   HDL 41 09/11/2021 0842   CHOLHDL 2.5 09/11/2021 0842   LDLCALC 47 09/11/2021 0842     Risk Assessment/Calculations:           Physical Exam:    VS:  BP 120/80 (BP Location: Left Arm, Patient Position: Sitting, Cuff Size: Normal)   Pulse 65   Ht 6' (1.829 m)   Wt 198 lb (89.8 kg)   SpO2 94%   BMI 26.85 kg/m     Wt Readings from Last 3 Encounters:  01/28/22 198 lb (89.8 kg)  02/23/21 204 lb (92.5 kg)  01/18/20 202 lb 3.2 oz (91.7 kg)     GEN: Well nourished, well developed, in no acute distress HEENT: normal Neck: no JVD, carotid bruits, or masses Cardiac: RRR; no murmurs, rubs, or gallops,no edema  Respiratory:  clear to auscultation bilaterally, normal work of breathing GI: soft, nontender, nondistended, + BS MS: no deformity or atrophy Skin: warm and dry, mild forearm rash noted after chopping oak tree Neuro:  Alert and Oriented x 3, Strength and sensation are intact Psych: euthymic mood, full affect   ASSESSMENT:    1. Pre-procedure lab exam   2. SOB (shortness of breath)   3. Coronary artery disease due to lipid rich plaque     PLAN:    In order of problems listed above:  Dyspnea and dizziness --will check ECHO to ensure proper function, dyspnea could be anginal equivalent.  He has trouble getting up 1 flight of stairs without significant shortness of breath.  He may have a degree of heart failure. --We will also check a left heart catheterization given his symptoms.  He does have severe underlying coronary artery disease/calcification however prior CT scan did not show any evidence of FFR flow abnormalities 2 years ago.  We will go ahead and check a heart catheterization to ensure that there is no significant obstructive disease.   Coronary artery disease Hyperlipidemia --CT 2021 - Ca score 2000's but FFR with no flow limiting  disease-checking heart catheterization for verification.  There could have been advancement of disease. --LDL 47 on meds as above   Hypertension -Has been excellently controlled with Norvasc 5 mg, hydrochlorothiazide 12.5 mg daily.  Appreciate the assistance of the hypertension clinic.  Could he have a degree of orthostatics.  Checking today.  119/76, 61 laying down, 120/79, 68 sitting, 109/7574 mild dizziness standing, 112/74, 71 mild dizziness. -Because of this, we will decrease his amlodipine to 2.5 mg once a day.  Continue to monitor symptoms.     Nephrolithiasis --Chronic nephrolithiasis.  Conservative management.  He has seen urologist  1 month with APP.   Medication Adjustments/Labs and Tests Ordered: Current medicines are reviewed at length with the patient today.  Concerns regarding medicines are outlined above.  Orders Placed This Encounter  Procedures   CBC   Basic metabolic panel   EKG 51-VOHY   ECHOCARDIOGRAM COMPLETE   Meds ordered this encounter  Medications   aspirin EC 81 MG tablet    Sig: Take 1 tablet (81 mg total) by mouth daily. Swallow whole.    Dispense:  30 tablet    Refill:  12   amLODipine (NORVASC) 2.5 MG tablet    Sig: Take 1 tablet (2.5 mg total) by mouth daily.    Dispense:  90 tablet    Refill:  3     Patient Instructions  Medication Instructions:  Please start Aspirin 81 mg a day. Decrease Amlodipine to 2.5 mg a day. Continue all other medications as listed.  *If you need a refill on your cardiac medications before your next appointment, please call your pharmacy*  Lab Work: Please have blood work today.  (CBC. BMP)  If you have labs (blood work) drawn today and your tests are completely normal, you will receive your results only by: MyChart Message (if you have MyChart) OR A paper copy in the mail If you have any lab test that is abnormal or we need to change your treatment, we will call you to review the  results.  Testing/Procedures: Your physician has requested that you have an echocardiogram. Echocardiography is a painless test that uses sound waves to create images of your heart. It provides your doctor with information about the size and shape of your heart and how well your heart's chambers and valves are working. This procedure takes approximately one hour. There are no restrictions for this procedure.     Cardiac/Peripheral Catheterization   You are scheduled for a Cardiac Catheterization on Friday, September 22 with Dr. Lauree Chandler.  1. Please arrive at the Main Entrance A at [facility]: [place], [place] [address] on September 22 at 5:30 AM (This time is two hours before your procedure to ensure your preparation). Free valet parking service is available. You will check in at ADMITTING. The support person will be asked to wait in the waiting room.  It is OK to have someone drop you off and come back when you are ready to be discharged.        Special note: Every effort is made to have your procedure done on time. Please understand that emergencies sometimes delay scheduled procedures.   . 2. Diet: Do not eat solid foods after midnight.  You may have clear liquids until 5 AM the day of the procedure.  3. Labs: You will need to have blood drawn today.  4. Medication instructions in preparation for your procedure:   Contrast Allergy: No  On the morning of your procedure, take Aspirin 81 mg and any morning medicines NOT listed above.  You may use sips of water. Hold Hydrochlorothiazide this morning.  5. Plan to go home the same day, you will only stay overnight if medically necessary. 6. You MUST have a responsible adult to drive you home. 7. An adult MUST be with you the first 24 hours after you arrive home. 8. Bring a current list of your medications, and the last time and date medication taken. 9. Bring ID and current insurance cards. 10.Please  wear clothes  that are easy to get on and off and wear slip-on shoes.  Thank you for allowing [place] to care for you!   -- [place] [facility]  Follow-Up: At [facility], you and your health needs are our priority.  As part of our continuing mission to provide you with exceptional heart care, we have created designated Provider Care Teams.  These Care Teams include your primary Cardiologist (physician) and Advanced Practice Providers (APPs -  Physician Assistants and Nurse Practitioners) who all work together to provide you with the care you need, when you need it.  We recommend signing up for the patient portal called "MyChart".  Sign up information is provided on this After Visit Summary.  MyChart is used to connect with patients for Virtual Visits (Telemedicine).  Patients are able to view lab/test results, encounter notes, upcoming appointments, etc.  Non-urgent messages can be sent to your provider as well.   To learn more about what you can do with MyChart, go to [email or web address].    Your next appointment:   1 month(s)  The format for your next appointment:   In Person  Provider:   Robbie Lis, PA-C, Nicholes Rough, PA-C, Ambrose Pancoast, NP, Ermalinda Barrios, PA-C, Christen Bame, NP, or Richardson Dopp, PA-C         Important Information About Sugar         Signed, Candee Furbish, MD  01/28/2022 9:26 AM    [place]

## 2022-01-28 NOTE — Patient Instructions (Addendum)
Medication Instructions:  Please start Aspirin 81 mg a day. Decrease Amlodipine to 2.5 mg a day. Continue all other medications as listed.  *If you need a refill on your cardiac medications before your next appointment, please call your pharmacy*  Lab Work: Please have blood work today.  (CBC. BMP)  If you have labs (blood work) drawn today and your tests are completely normal, you will receive your results only by: MyChart Message (if you have MyChart) OR A paper copy in the mail If you have any lab test that is abnormal or we need to change your treatment, we will call you to review the results.  Testing/Procedures: Your physician has requested that you have an echocardiogram. Echocardiography is a painless test that uses sound waves to create images of your heart. It provides your doctor with information about the size and shape of your heart and how well your heart's chambers and valves are working. This procedure takes approximately one hour. There are no restrictions for this procedure.     Cardiac/Peripheral Catheterization   You are scheduled for a Cardiac Catheterization on Friday, September 22 with Dr. Lauree Chandler.  1. Please arrive at the Main Entrance A at [facility]: [place], [place] [address] on September 22 at 5:30 AM (This time is two hours before your procedure to ensure your preparation). Free valet parking service is available. You will check in at ADMITTING. The support person will be asked to wait in the waiting room.  It is OK to have someone drop you off and come back when you are ready to be discharged.        Special note: Every effort is made to have your procedure done on time. Please understand that emergencies sometimes delay scheduled procedures.   . 2. Diet: Do not eat solid foods after midnight.  You may have clear liquids until 5 AM the day of the procedure.  3. Labs: You will need to have blood drawn today.  4.  Medication instructions in preparation for your procedure:   Contrast Allergy: No  On the morning of your procedure, take Aspirin 81 mg and any morning medicines NOT listed above.  You may use sips of water. Hold Hydrochlorothiazide this morning.  5. Plan to go home the same day, you will only stay overnight if medically necessary. 6. You MUST have a responsible adult to drive you home. 7. An adult MUST be with you the first 24 hours after you arrive home. 8. Bring a current list of your medications, and the last time and date medication taken. 9. Bring ID and current insurance cards. 10.Please wear clothes that are easy to get on and off and wear slip-on shoes.  Thank you for allowing [place] to care for you!   -- [facility]  Follow-Up: At [facility], you and your health needs are our priority.  As part of our continuing mission to provide you with exceptional heart care, we have created designated Provider Care Teams.  These Care Teams include your primary Cardiologist (physician) and Advanced Practice Providers (APPs -  Physician Assistants and Nurse Practitioners) who all work together to provide you with the care you need, when you need it.  We recommend signing up for the patient portal called "MyChart".  Sign up information is provided on this After Visit Summary.  MyChart is used to connect with patients for Virtual Visits (Telemedicine).  Patients are able to view lab/test  results, encounter notes, upcoming appointments, etc.  Non-urgent messages can be sent to your provider as well.   To learn more about what you can do with MyChart, go to [email or web address].    Your next appointment:   1 month(s)  The format for your next appointment:   In Person  Provider:   Robbie Lis, PA-C, Nicholes Rough, PA-C, Ambrose Pancoast, NP, Ermalinda Barrios, PA-C, Christen Bame, NP, or Richardson Dopp, PA-C         Important Information About Sugar

## 2022-01-29 ENCOUNTER — Encounter: Payer: Self-pay | Admitting: Pharmacist

## 2022-01-29 ENCOUNTER — Other Ambulatory Visit: Payer: Self-pay | Admitting: Cardiology

## 2022-01-29 DIAGNOSIS — R931 Abnormal findings on diagnostic imaging of heart and coronary circulation: Secondary | ICD-10-CM

## 2022-01-31 ENCOUNTER — Telehealth: Payer: Self-pay | Admitting: *Deleted

## 2022-01-31 NOTE — Telephone Encounter (Addendum)
Cardiac Catheterization scheduled at [facility] for: Friday February 01, 2022 7:30 AM Arrival time and place: [place] Entrance A at: 5:30 AM  Nothing to eat after midnight prior to procedure, clear liquids until 5 AM day of procedure.  Medication instructions: -Hold:  HCTZ-AM of procedure -Except hold medications usual morning medications can be taken with sips of water including aspirin 81 mg.  Confirmed patient has responsible adult to drive home post procedure and be with patient first 24 hours after arriving home.  Patient reports no new symptoms concerning for COVID-19 in the past 10 days.  Reviewed procedure instructions with patient.

## 2022-02-01 ENCOUNTER — Encounter (HOSPITAL_COMMUNITY)
Admission: RE | Disposition: A | Discharge status: Home / Self Care | Payer: Self-pay | Source: Home / Self Care | Attending: Cardiovascular Disease

## 2022-02-01 ENCOUNTER — Ambulatory Visit (HOSPITAL_COMMUNITY)
Admission: RE | Admit: 2022-02-01 | Discharge: 2022-02-01 | Disposition: A | Discharge status: Home / Self Care | Payer: Medicare Other | Attending: Cardiovascular Disease | Admitting: Cardiovascular Disease

## 2022-02-01 ENCOUNTER — Encounter (HOSPITAL_COMMUNITY): Payer: Self-pay | Admitting: Cardiovascular Disease

## 2022-02-01 ENCOUNTER — Other Ambulatory Visit: Payer: Self-pay

## 2022-02-01 DIAGNOSIS — N200 Calculus of kidney: Secondary | ICD-10-CM | POA: Diagnosis not present

## 2022-02-01 DIAGNOSIS — I10 Essential (primary) hypertension: Secondary | ICD-10-CM | POA: Diagnosis not present

## 2022-02-01 DIAGNOSIS — E785 Hyperlipidemia, unspecified: Secondary | ICD-10-CM | POA: Insufficient documentation

## 2022-02-01 DIAGNOSIS — R0602 Shortness of breath: Secondary | ICD-10-CM | POA: Insufficient documentation

## 2022-02-01 DIAGNOSIS — R931 Abnormal findings on diagnostic imaging of heart and coronary circulation: Secondary | ICD-10-CM

## 2022-02-01 DIAGNOSIS — R42 Dizziness and giddiness: Secondary | ICD-10-CM | POA: Diagnosis not present

## 2022-02-01 DIAGNOSIS — I2583 Coronary atherosclerosis due to lipid rich plaque: Secondary | ICD-10-CM | POA: Insufficient documentation

## 2022-02-01 DIAGNOSIS — I25118 Atherosclerotic heart disease of native coronary artery with other forms of angina pectoris: Secondary | ICD-10-CM | POA: Diagnosis not present

## 2022-02-01 HISTORY — PX: LEFT HEART CATH AND CORONARY ANGIOGRAPHY: CATH118249

## 2022-02-01 SURGERY — LEFT HEART CATH AND CORONARY ANGIOGRAPHY
Anesthesia: LOCAL

## 2022-02-01 MED ORDER — IOHEXOL 350 MG/ML SOLN
INTRAVENOUS | Status: DC | PRN
  Administered 2022-02-01: 22 mL

## 2022-02-01 MED ORDER — VERAPAMIL HCL 2.5 MG/ML IV SOLN
INTRAVENOUS | Status: DC | PRN
  Administered 2022-02-01: 10 mL via INTRA_ARTERIAL

## 2022-02-01 MED ORDER — HYDRALAZINE HCL 20 MG/ML IJ SOLN: 10.0000 mg | INTRAMUSCULAR | Status: DC | PRN

## 2022-02-01 MED ORDER — HEPARIN (PORCINE) IN NACL 1000-0.9 UT/500ML-% IV SOLN
INTRAVENOUS | Status: AC
  Filled 2022-02-01: qty 1000

## 2022-02-01 MED ORDER — ASPIRIN 81 MG PO CHEW: 81.0000 mg | CHEWABLE_TABLET | ORAL | Status: DC

## 2022-02-01 MED ORDER — SODIUM CHLORIDE 0.9 % IV SOLN: 250.0000 mL | INTRAVENOUS | Status: DC | PRN

## 2022-02-01 MED ORDER — VERAPAMIL HCL 2.5 MG/ML IV SOLN
INTRAVENOUS | Status: AC
  Filled 2022-02-01: qty 2

## 2022-02-01 MED ORDER — SODIUM CHLORIDE 0.9% FLUSH: 3.0000 mL | INTRAVENOUS | Status: DC | PRN

## 2022-02-01 MED ORDER — HEPARIN SODIUM (PORCINE) 1000 UNIT/ML IJ SOLN
INTRAMUSCULAR | Status: AC
  Filled 2022-02-01: qty 10

## 2022-02-01 MED ORDER — ONDANSETRON HCL 4 MG/2ML IJ SOLN: 4.0000 mg | Freq: Four times a day (QID) | INTRAMUSCULAR | Status: DC | PRN

## 2022-02-01 MED ORDER — LIDOCAINE HCL (PF) 1 % IJ SOLN
INTRAMUSCULAR | Status: DC | PRN
  Administered 2022-02-01: 2 mL

## 2022-02-01 MED ORDER — SODIUM CHLORIDE 0.9% FLUSH: 3.0000 mL | Freq: Two times a day (BID) | INTRAVENOUS | Status: DC

## 2022-02-01 MED ORDER — MIDAZOLAM HCL 2 MG/2ML IJ SOLN
INTRAMUSCULAR | Status: AC
  Filled 2022-02-01: qty 2

## 2022-02-01 MED ORDER — FENTANYL CITRATE (PF) 100 MCG/2ML IJ SOLN
INTRAMUSCULAR | Status: AC
  Filled 2022-02-01: qty 2

## 2022-02-01 MED ORDER — ACETAMINOPHEN 325 MG PO TABS: 650.0000 mg | ORAL_TABLET | ORAL | Status: DC | PRN

## 2022-02-01 MED ORDER — MIDAZOLAM HCL 2 MG/2ML IJ SOLN
INTRAMUSCULAR | Status: DC | PRN
  Administered 2022-02-01 (×2): 1 mg via INTRAVENOUS

## 2022-02-01 MED ORDER — LIDOCAINE HCL (PF) 1 % IJ SOLN
INTRAMUSCULAR | Status: AC
  Filled 2022-02-01: qty 30

## 2022-02-01 MED ORDER — HEPARIN SODIUM (PORCINE) 1000 UNIT/ML IJ SOLN
INTRAMUSCULAR | Status: DC | PRN
  Administered 2022-02-01: 5000 [IU] via INTRAVENOUS

## 2022-02-01 MED ORDER — SODIUM CHLORIDE 0.9 % IV SOLN: INTRAVENOUS | Status: DC

## 2022-02-01 MED ORDER — SODIUM CHLORIDE 0.9 % WEIGHT BASED INFUSION
3.0000 mL/kg/h | INTRAVENOUS | Status: AC
  Administered 2022-02-01: 3 mL/kg/h via INTRAVENOUS

## 2022-02-01 MED ORDER — LABETALOL HCL 5 MG/ML IV SOLN: 10.0000 mg | INTRAVENOUS | Status: DC | PRN

## 2022-02-01 MED ORDER — FENTANYL CITRATE (PF) 100 MCG/2ML IJ SOLN
INTRAMUSCULAR | Status: DC | PRN
  Administered 2022-02-01 (×2): 25 ug via INTRAVENOUS

## 2022-02-01 MED ORDER — HEPARIN (PORCINE) IN NACL 1000-0.9 UT/500ML-% IV SOLN
INTRAVENOUS | Status: DC | PRN
  Administered 2022-02-01 (×2): 500 mL

## 2022-02-01 MED ORDER — SODIUM CHLORIDE 0.9 % WEIGHT BASED INFUSION: 1.0000 mL/kg/h | INTRAVENOUS | Status: DC

## 2022-02-01 SURGICAL SUPPLY — 11 items
CATH 5FR JL3.5 JR4 ANG PIG MP (CATHETERS) IMPLANT
DEVICE RAD COMP TR BAND LRG (VASCULAR PRODUCTS) IMPLANT
GLIDESHEATH SLEND SS 6F .021 (SHEATH) IMPLANT
GUIDEWIRE INQWIRE 1.5J.035X260 (WIRE) IMPLANT
INQWIRE 1.5J .035X260CM (WIRE) ×1
KIT HEART LEFT (KITS) ×1 IMPLANT
KIT SYRINGE INJ CVI SPIKEX1 (MISCELLANEOUS) IMPLANT
PACK CARDIAC CATHETERIZATION (CUSTOM PROCEDURE TRAY) ×1 IMPLANT
SET ATX SIMPLICITY (MISCELLANEOUS) IMPLANT
TUBING CIL FLEX 10 FLL-RA (TUBING) ×1 IMPLANT
WIRE HI TORQ VERSACORE-J 145CM (WIRE) IMPLANT

## 2022-02-01 NOTE — Progress Notes (Signed)
TR BAND REMOVAL  LOCATION:    right radial  DEFLATED PER PROTOCOL:    Yes.    TIME BAND OFF / DRESSING APPLIED:    1000   SITE UPON ARRIVAL:    Level 0  SITE AFTER BAND REMOVAL:    Level 0  CIRCULATION SENSATION AND MOVEMENT:    Within Normal Limits   Yes.    COMMENTS:   No bleeding noted  

## 2022-02-01 NOTE — Interval H&P Note (Signed)
History and Physical Interval Note:  02/01/2022 7:23 AM  Vernie Murders  has presented today for surgery, with the diagnosis of CAD, shortness of breath.  The various methods of treatment have been discussed with the patient and family. After consideration of risks, benefits and other options for treatment, the patient has consented to  Procedure(s): LEFT HEART CATH AND CORONARY ANGIOGRAPHY (N/A) as a surgical intervention.  The patient's history has been reviewed, patient examined, no change in status, stable for surgery.  I have reviewed the patient's chart and labs.  Questions were answered to the patient's satisfaction.    Cath Lab Visit (complete for each Cath Lab visit)  Clinical Evaluation Leading to the Procedure:   ACS: No.  Non-ACS:    Anginal Classification: CCS II  Anti-ischemic medical therapy: Minimal Therapy (1 class of medications)  Non-Invasive Test Results: No non-invasive testing performed  Prior CABG: No previous CABG        Lauree Chandler

## 2022-02-01 NOTE — Discharge Instructions (Signed)

## 2022-02-01 NOTE — Progress Notes (Signed)
Patient was given discharge instructions. He verbalized understanding. 

## 2022-02-04 ENCOUNTER — Other Ambulatory Visit: Payer: Self-pay | Admitting: *Deleted

## 2022-02-04 ENCOUNTER — Encounter: Payer: Self-pay | Admitting: Cardiology

## 2022-02-04 ENCOUNTER — Ambulatory Visit: Payer: Medicare Other | Attending: Cardiology

## 2022-02-04 DIAGNOSIS — R55 Syncope and collapse: Secondary | ICD-10-CM

## 2022-02-04 DIAGNOSIS — R42 Dizziness and giddiness: Secondary | ICD-10-CM

## 2022-02-04 NOTE — Progress Notes (Unsigned)
Enrolled for [employer] to mail a ZIO XT long term holter monitor to the patients address on file.  

## 2022-02-04 NOTE — Progress Notes (Signed)
Please see MyChart encounter for further documents of these orders. D/C Amlodipine, HCTZ, wear ZIO patch monitor for pt's c/o dizziness and "passing out."

## 2022-02-12 ENCOUNTER — Ambulatory Visit (HOSPITAL_COMMUNITY): Payer: Medicare Other | Attending: Cardiology

## 2022-02-12 DIAGNOSIS — I2510 Atherosclerotic heart disease of native coronary artery without angina pectoris: Secondary | ICD-10-CM | POA: Insufficient documentation

## 2022-02-12 DIAGNOSIS — I2583 Coronary atherosclerosis due to lipid rich plaque: Secondary | ICD-10-CM | POA: Diagnosis not present

## 2022-02-12 DIAGNOSIS — R0602 Shortness of breath: Secondary | ICD-10-CM | POA: Diagnosis not present

## 2022-02-12 LAB — ECHOCARDIOGRAM COMPLETE
Area-P 1/2: 2.8 cm2
S' Lateral: 3.1 cm

## 2022-02-14 DIAGNOSIS — D485 Neoplasm of uncertain behavior of skin: Secondary | ICD-10-CM | POA: Diagnosis not present

## 2022-02-14 DIAGNOSIS — L853 Xerosis cutis: Secondary | ICD-10-CM | POA: Diagnosis not present

## 2022-02-14 DIAGNOSIS — L918 Other hypertrophic disorders of the skin: Secondary | ICD-10-CM | POA: Diagnosis not present

## 2022-02-14 DIAGNOSIS — L570 Actinic keratosis: Secondary | ICD-10-CM | POA: Diagnosis not present

## 2022-02-14 DIAGNOSIS — D1801 Hemangioma of skin and subcutaneous tissue: Secondary | ICD-10-CM | POA: Diagnosis not present

## 2022-02-14 DIAGNOSIS — R55 Syncope and collapse: Secondary | ICD-10-CM

## 2022-02-14 DIAGNOSIS — L821 Other seborrheic keratosis: Secondary | ICD-10-CM | POA: Diagnosis not present

## 2022-02-14 DIAGNOSIS — L814 Other melanin hyperpigmentation: Secondary | ICD-10-CM | POA: Diagnosis not present

## 2022-02-14 DIAGNOSIS — R42 Dizziness and giddiness: Secondary | ICD-10-CM

## 2022-02-14 DIAGNOSIS — Z85828 Personal history of other malignant neoplasm of skin: Secondary | ICD-10-CM | POA: Diagnosis not present

## 2022-02-14 DIAGNOSIS — C44612 Basal cell carcinoma of skin of right upper limb, including shoulder: Secondary | ICD-10-CM | POA: Diagnosis not present

## 2022-02-14 DIAGNOSIS — D2239 Melanocytic nevi of other parts of face: Secondary | ICD-10-CM | POA: Diagnosis not present

## 2022-02-19 DIAGNOSIS — Z23 Encounter for immunization: Secondary | ICD-10-CM | POA: Diagnosis not present

## 2022-02-28 DIAGNOSIS — Z85828 Personal history of other malignant neoplasm of skin: Secondary | ICD-10-CM | POA: Diagnosis not present

## 2022-02-28 DIAGNOSIS — L821 Other seborrheic keratosis: Secondary | ICD-10-CM | POA: Diagnosis not present

## 2022-02-28 DIAGNOSIS — L570 Actinic keratosis: Secondary | ICD-10-CM | POA: Diagnosis not present

## 2022-02-28 DIAGNOSIS — C44612 Basal cell carcinoma of skin of right upper limb, including shoulder: Secondary | ICD-10-CM | POA: Diagnosis not present

## 2022-03-07 ENCOUNTER — Ambulatory Visit: Payer: Medicare Other | Attending: Cardiology | Admitting: Cardiology

## 2022-03-07 ENCOUNTER — Encounter: Payer: Self-pay | Admitting: Cardiology

## 2022-03-07 VITALS — BP 110/70 | HR 75 | Ht 72.0 in | Wt 201.0 lb

## 2022-03-07 DIAGNOSIS — R42 Dizziness and giddiness: Secondary | ICD-10-CM

## 2022-03-07 DIAGNOSIS — I2510 Atherosclerotic heart disease of native coronary artery without angina pectoris: Secondary | ICD-10-CM | POA: Diagnosis not present

## 2022-03-07 DIAGNOSIS — I2583 Coronary atherosclerosis due to lipid rich plaque: Secondary | ICD-10-CM | POA: Diagnosis not present

## 2022-03-07 DIAGNOSIS — E785 Hyperlipidemia, unspecified: Secondary | ICD-10-CM | POA: Diagnosis not present

## 2022-03-07 NOTE — Progress Notes (Signed)
Cardiology Office Note:    Date:  03/07/2022   ID:  Cody Haas, DOB 1945/06/11, MRN [id number]  PCP:  Stephani Police, MD   [facility] Providers Cardiologist:  Candee Furbish, MD     Referring MD: Stephani Police, MD     History of Present Illness:    Cody Haas is a 76 y.o. male here for follow up of severe coronary artery calcification with nonobstructive coronary artery disease on cardiac catheterization 01/2022, difficult to control hyperlipidemia, dizziness shortness of breath.    02/01/2022 He had a left heart cath which went well and is currently on a cardiac monitor  Prior follow-up of difficult to control hypertension, dizziness at times. Complaints of increasing fatigue, SOB on exertion and dizziness when standing too quickly.  Pt states he saw his PCP who recommended a follow up with cardiology.  Newest addition was amlodipine. See MD in [place] as well. Has cardiologist there too.   Last visit he stated that he could hardly do any type of activity due to fatigue or SOB, especially climbing stairs.  He also complains of dizziness with position change.  Pt denies current CP.  He does not have current BP readings. Has had cough for 2 weeks.  Chest x-ray unremarkable. Also has had chest discomfort in the past.  Coronary calcium was highly elevated at 2200 despite this vessels were large in caliber and FFR were non flow limiting on coronary CT scan with contrast. He has had mild chest discomfort and shoulder discomfort at times.  Thought it may have been the Crestor 20 mg.  Now on '10mg'$ . Bempedoic Acid and Zetia '10mg'$  (Nexlizet) added as well for his protection by lipid clinic.  We discussed the importance of high intensity statin.Previously he was also worried about potential Lyme disease having ticks occasionally. Intolerant of Valsartan   Today, the patient states that he has been feeling much better and has noticed a greatly improved blood pressure that is now 110/70 in  clinic when it used to run around 789 systolic. He states that at home his blood pressure runs higher at home around 130 to 140, he is pleasantly surprised that his blood pressure is very good in clinic  He walks a mile or two a day for exercise but says he is out of shape, and is not as active as he should be. If he walks up a good sized hill he will be short of breath. He attributes his shortness of breath to deconditioning. He is wondering if he is able to exercise more often due to concerns about a MI, which we discussed  He has been wearing a cardiac monitor two weeks and has felt dizziness once during his monitoring which he reported but is much less than normal.  He has finished Prednisone and is no longer taking the medication  He denies any palpitations, chest pain, or peripheral edema. No lightheadedness, headaches, syncope, orthopnea, or PND.   Past Medical History:  Diagnosis Date   Chest pain    Dizziness     Past Surgical History:  Procedure Laterality Date   LEFT HEART CATH AND CORONARY ANGIOGRAPHY N/A 02/01/2022   Procedure: LEFT HEART CATH AND CORONARY ANGIOGRAPHY;  Surgeon: Burnell Blanks, MD;  Location: [place] [facility];  Service: Cardiovascular;  Laterality: N/A;    Current Medications: Current Meds  Medication Sig   ALPHA LIPOIC ACID-BIOTIN PO Take 1 capsule by mouth in the morning. ([employer]) With Acetyl L-Carnitine  Ascorbic Acid (VITAMIN C PO) Take 1 tablet by mouth daily with lunch.   aspirin EC 81 MG tablet Take 1 tablet (81 mg total) by mouth daily. Swallow whole.   B Complex-C (B-COMPLEX WITH VITAMIN C) tablet Take 1 tablet by mouth daily with lunch.   Bempedoic Acid-Ezetimibe (NEXLIZET) 180-10 MG TABS Take 1 tablet by mouth daily.   Cholecalciferol (VITAMIN D3 PO) Take 1 tablet by mouth in the morning.   COENZYME Q10 PO Take 100 mg by mouth in the morning.   hydrocortisone cream (CORTIZONE 10 PLUS) 1 % Apply 1 Application topically 3  (three) times daily as needed for itching.   MAGNESIUM PO Take 1 tablet by mouth daily with lunch.   methylPREDNISolone (MEDROL) 4 MG tablet Take 4 mg by mouth See admin instructions. TAKE THESE NUMBER OF TABLETS ON CONSECUTIVE DAYS:6-5-4-3-2-1   omeprazole (PRILOSEC) 40 MG capsule Take 40 mg by mouth every evening.   rosuvastatin (CRESTOR) 10 MG tablet TAKE 1 TABLET(10 MG) BY MOUTH DAILY     Allergies:   Codeine and Penicillin g   Social History   Socioeconomic History   Marital status: Married    Spouse name: Not on file   Number of children: Not on file   Years of education: Not on file   Highest education level: Not on file  Occupational History   Not on file  Tobacco Use   Smoking status: Never   Smokeless tobacco: Never  Substance and Sexual Activity   Alcohol use: No   Drug use: No   Sexual activity: Not on file  Other Topics Concern   Not on file  Social History Narrative   Not on file   Social Determinants of Health   Financial Resource Strain: Not on file  Food Insecurity: Not on file  Transportation Needs: Not on file  Physical Activity: Not on file  Stress: Not on file  Social Connections: Not on file     Family History: The patient's family history is not on file.  ROS:   Please see the history of present illness.    (+)Dizziness (+)Exertional shortness of breath All other systems reviewed and are negative.  EKGs/Labs/Other Studies Reviewed:    The following studies were reviewed today:   Left heart cath and coronary angiography: 02/01/22    Prox RCA lesion is 20% stenosed.   Dist RCA lesion is 20% stenosed.   RPAV lesion is 20% stenosed.   Ost Cx to Prox Cx lesion is 20% stenosed.   Prox LAD to Mid LAD lesion is 50% stenosed.   Ost LAD to Prox LAD lesion is 30% stenosed.   Mid LAD to Dist LAD lesion is 20% stenosed.   1st Diag lesion is 50% stenosed.   Mild to moderate non-obstructive disease in the proximal and mid LAD Moderate  non-obstructive disease in the large caliber Diagonal branch Large Circumflex artery with mild non-obstructive disease Large, dominant RCA with mild diffuse non-obstructive disease.  Normal LV filling pressures   Recommendations: Medical management of non-obstructive CAD  Coronary CT 10/14/19  FINDINGS: FFRct analysis was performed on the original cardiac CT angiogram dataset. Diagrammatic representation of the FFRct analysis is provided in a separate PDF document in PACS. This dictation was created using the PDF document and an interactive 3D model of the results. 3D model is not available in the EMR/PACS. Normal FFR range is >0.80.   LM: 0.98   LAD: 0.97, 0.96, 0.89   Circ: 0.97, 0.97, 0.91  RCA: 0.96, 0.96, 0.86 (PDA)   IMPRESSION: 1. Large caliber vessels with no flow limiting CAD despite large calcium burden.   2.  Recommend aggressive medical management.  EKG:  EKG is personally reviewed  01/28/2022: EKG demonstrates sinus rhythm 64 no other abnormalities  Recent Labs: 09/11/2021: ALT 28 01/28/2022: BUN 24; Creatinine, Ser 1.12; Hemoglobin 13.2; Platelets 283; Potassium 4.5; Sodium 139  Recent Lipid Panel    Component Value Date/Time   CHOL 103 09/11/2021 0842   TRIG 72 09/11/2021 0842   HDL 41 09/11/2021 0842   CHOLHDL 2.5 09/11/2021 0842   LDLCALC 47 09/11/2021 0842     Risk Assessment/Calculations:           Physical Exam:    VS:  BP 110/70 (BP Location: Left Arm, Patient Position: Sitting, Cuff Size: Normal)   Pulse 75   Ht 6' (1.829 m)   Wt 201 lb (91.2 kg)   SpO2 92%   BMI 27.26 kg/m     Wt Readings from Last 3 Encounters:  03/07/22 201 lb (91.2 kg)  02/01/22 200 lb (90.7 kg)  01/28/22 198 lb (89.8 kg)     GEN: Well nourished, well developed, in no acute distress HEENT: normal Neck: no JVD, carotid bruits, or masses Cardiac:  RRR; no murmurs, rubs, or gallops,no edema  Respiratory:  clear to auscultation bilaterally, normal work of  breathing GI: soft, nontender, nondistended, + BS MS: no deformity or atrophy Skin: warm and dry, mild forearm rash noted after chopping oak tree Neuro:  Alert and Oriented x 3, Strength and sensation are intact Psych: euthymic mood, full affect   ASSESSMENT:    1. Coronary artery disease involving native coronary artery of native heart without angina pectoris   2. Hyperlipidemia, unspecified hyperlipidemia type   3. Dizziness      PLAN:    In order of problems listed above:  Dyspnea and dizziness -- Overall reassuring echocardiogram.  Cardiac catheterization showed nonobstructive moderate coronary artery disease, calcium score was elevated.  ZIO monitor worn, awaiting processing.  Coronary artery disease Hyperlipidemia --CT 2021 - Ca score 2000's but FFR with no flow limiting disease-checking heart catheterization for verification.  There could have been advancement of disease. --LDL 47 on meds as above taking Nexlizet, bempedoic acid and ezetimibe, Crestor 10.  Excellent.  Expensive.  Hypertension - Prior orthostatics 119/76, 61 laying down, 120/79, 68 sitting, 109/7574 mild dizziness standing, 112/74, 71 mild dizziness. - We stopped his amlodipine 2.5 mg because of this.  He had called with more dizziness sensation.  He is doing quite well.  His blood pressures at home can run in the 130s at times.  Today 110/70.  Nephrolithiasis --Chronic nephrolithiasis.  Conservative management.  He has seen urologist  Follow up: 6 months   Medication Adjustments/Labs and Tests Ordered: Current medicines are reviewed at length with the patient today.  Concerns regarding medicines are outlined above.  No orders of the defined types were placed in this encounter.  No orders of the defined types were placed in this encounter.  Patient Instructions  Medication Instructions:  The current medical regimen is effective;  continue present plan and medications.  *If you need a refill on  your cardiac medications before your next appointment, please call your pharmacy*  Follow-Up: At [facility], you and your health needs are our priority.  As part of our continuing mission to provide you with exceptional heart care, we have created designated Provider Care Teams.  These Care  Teams include your primary Cardiologist (physician) and Advanced Practice Providers (APPs -  Physician Assistants and Nurse Practitioners) who all work together to provide you with the care you need, when you need it.  We recommend signing up for the patient portal called "MyChart".  Sign up information is provided on this After Visit Summary.  MyChart is used to connect with patients for Virtual Visits (Telemedicine).  Patients are able to view lab/test results, encounter notes, upcoming appointments, etc.  Non-urgent messages can be sent to your provider as well.   To learn more about what you can do with MyChart, go to [email or web address].    Your next appointment:   6 month(s)  The format for your next appointment:   In Person  Provider:   Candee Furbish, MD      Important Information About Sugar          I,Coren O'Brien,acting as a scribe for Candee Furbish, MD.,have documented all relevant documentation on the behalf of Candee Furbish, MD,as directed by  Candee Furbish, MD while in the presence of Candee Furbish, MD.  I, Candee Furbish, MD, have reviewed all documentation for this visit. The documentation on 03/07/22 for the exam, diagnosis, procedures, and orders are all accurate and complete.   Signed, Candee Furbish, MD  03/07/2022 10:25 AM    [facility]

## 2022-03-07 NOTE — Patient Instructions (Signed)
Medication Instructions:  The current medical regimen is effective;  continue present plan and medications.  *If you need a refill on your cardiac medications before your next appointment, please call your pharmacy*  Follow-Up: At [facility], you and your health needs are our priority.  As part of our continuing mission to provide you with exceptional heart care, we have created designated Provider Care Teams.  These Care Teams include your primary Cardiologist (physician) and Advanced Practice Providers (APPs -  Physician Assistants and Nurse Practitioners) who all work together to provide you with the care you need, when you need it.  We recommend signing up for the patient portal called "MyChart".  Sign up information is provided on this After Visit Summary.  MyChart is used to connect with patients for Virtual Visits (Telemedicine).  Patients are able to view lab/test results, encounter notes, upcoming appointments, etc.  Non-urgent messages can be sent to your provider as well.   To learn more about what you can do with MyChart, go to [email or web address].    Your next appointment:   6 month(s)  The format for your next appointment:   In Person  Provider:   Mark Skains, MD      Important Information About Sugar       

## 2022-03-08 DIAGNOSIS — R55 Syncope and collapse: Secondary | ICD-10-CM | POA: Diagnosis not present

## 2022-03-08 DIAGNOSIS — R42 Dizziness and giddiness: Secondary | ICD-10-CM | POA: Diagnosis not present

## 2022-03-12 ENCOUNTER — Encounter: Payer: Self-pay | Admitting: Cardiology

## 2022-03-12 ENCOUNTER — Other Ambulatory Visit: Payer: Self-pay | Admitting: *Deleted

## 2022-03-12 DIAGNOSIS — R55 Syncope and collapse: Secondary | ICD-10-CM

## 2022-03-12 DIAGNOSIS — R9431 Abnormal electrocardiogram [ECG] [EKG]: Secondary | ICD-10-CM

## 2022-03-20 ENCOUNTER — Ambulatory Visit: Payer: Medicare Other | Admitting: Cardiology

## 2022-03-21 ENCOUNTER — Other Ambulatory Visit: Payer: Self-pay | Admitting: Cardiology

## 2022-03-22 MED ORDER — NEXLIZET 180-10 MG PO TABS: 1.0000 | ORAL_TABLET | Freq: Every day | ORAL | 3 refills | Status: DC

## 2022-04-16 ENCOUNTER — Ambulatory Visit: Payer: Medicare Other | Attending: Internal Medicine | Admitting: Internal Medicine

## 2022-04-16 ENCOUNTER — Encounter: Payer: Self-pay | Admitting: Internal Medicine

## 2022-04-16 VITALS — BP 114/70 | HR 68 | Ht 72.0 in | Wt 202.0 lb

## 2022-04-16 DIAGNOSIS — I4719 Other supraventricular tachycardia: Secondary | ICD-10-CM | POA: Insufficient documentation

## 2022-04-16 DIAGNOSIS — I455 Other specified heart block: Secondary | ICD-10-CM | POA: Diagnosis not present

## 2022-04-16 NOTE — Progress Notes (Signed)
HPI Cody Haas is referred by Dr. Marlou Porch for evaluation of bradycardia and pauses. He is a pleasant 76 yo man with a h/o syncope and CAD. He notes that he was having a lot of symptoms but then stopped his beta blocker and he improved. He wore a cardiac monitor and was asymptomatic. He was noted to have a 4.5 second pause which he had no recollection of. He has not had chest pain, sob or syncope. He underwent left heart cath 3 months ago which demonstrated non-obstructive CAD despite a very high calcium score. Allergies  Allergen Reactions   Codeine Nausea And Vomiting   Penicillin G Swelling and Hives     Current Outpatient Medications  Medication Sig Dispense Refill   ALPHA LIPOIC ACID-BIOTIN PO Take 1 capsule by mouth in the morning. ([employer]) With Acetyl L-Carnitine     Ascorbic Acid (VITAMIN C PO) Take 1 tablet by mouth daily with lunch.     aspirin EC 81 MG tablet Take 1 tablet (81 mg total) by mouth daily. Swallow whole. 30 tablet 12   B Complex-C (B-COMPLEX WITH VITAMIN C) tablet Take 1 tablet by mouth daily with lunch.     Bempedoic Acid-Ezetimibe (NEXLIZET) 180-10 MG TABS Take 1 tablet by mouth daily. 90 tablet 3   Cholecalciferol (VITAMIN D3 PO) Take 1 tablet by mouth in the morning.     COENZYME Q10 PO Take 100 mg by mouth in the morning.     hydrocortisone cream (CORTIZONE 10 PLUS) 1 % Apply 1 Application topically 3 (three) times daily as needed for itching.     MAGNESIUM PO Take 1 tablet by mouth daily with lunch.     methylPREDNISolone (MEDROL) 4 MG tablet Take 4 mg by mouth See admin instructions. TAKE THESE NUMBER OF TABLETS ON CONSECUTIVE DAYS:6-5-4-3-2-1     omeprazole (PRILOSEC) 40 MG capsule Take 40 mg by mouth every evening.     rosuvastatin (CRESTOR) 10 MG tablet TAKE 1 TABLET(10 MG) BY MOUTH DAILY 90 tablet 0   No current facility-administered medications for this visit.     Past Medical History:  Diagnosis Date   Chest pain    Dizziness      ROS:   All systems reviewed and negative except as noted in the HPI.   Past Surgical History:  Procedure Laterality Date   LEFT HEART CATH AND CORONARY ANGIOGRAPHY N/A 02/01/2022   Procedure: LEFT HEART CATH AND CORONARY ANGIOGRAPHY;  Surgeon: Burnell Blanks, MD;  Location: [place] [facility];  Service: Cardiovascular;  Laterality: N/A;     History reviewed. No pertinent family history.   Social History   Socioeconomic History   Marital status: Married    Spouse name: Not on file   Number of children: Not on file   Years of education: Not on file   Highest education level: Not on file  Occupational History   Not on file  Tobacco Use   Smoking status: Never   Smokeless tobacco: Never  Substance and Sexual Activity   Alcohol use: No   Drug use: No   Sexual activity: Not on file  Other Topics Concern   Not on file  Social History Narrative   Not on file   Social Determinants of Health   Financial Resource Strain: Not on file  Food Insecurity: Not on file  Transportation Needs: Not on file  Physical Activity: Not on file  Stress: Not on file  Social Connections: Not on  file  Intimate Partner Violence: Not on file     BP 114/70   Pulse 68   Ht 6' (1.829 m)   Wt 202 lb (91.6 kg)   SpO2 94%   BMI 27.40 kg/m   Physical Exam:  Well appearing NAD HEENT: Unremarkable Neck:  No JVD, no thyromegally Lymphatics:  No adenopathy Back:  No CVA tenderness Lungs:  Clear HEART:  Regular rate rhythm, no murmurs, no rubs, no clicks Abd:  soft, positive bowel sounds, no organomegally, no rebound, no guarding Ext:  2 plus pulses, no edema, no cyanosis, no clubbing Skin:  No rashes no nodules Neuro:  CN II through XII intact, motor grossly intact  EKG - nsr  Assess/Plan: Bradycardia - we discussed the symptoms with the patient. Since his beta blocker was stopped 2 years ago he is much improved. Despite his morning pause in October, I cannot get him to  describe any symptoms and recommended he undergo watchful waiting. I discussed the symptoms he might experience if he develops symptomatic bradycardia. CAD - note no obstruction on left heart cath.  Dyslipidemia - continue statin therapy.  Carleene Overlie Mujtaba Bollig,MD

## 2022-04-16 NOTE — Patient Instructions (Addendum)
Medication Instructions:  Your physician recommends that you continue on your current medications as directed. Please refer to the Current Medication list given to you today.  *If you need a refill on your cardiac medications before your next appointment, please call your pharmacy*  Lab Work: None ordered.  If you have labs (blood work) drawn today and your tests are completely normal, you will receive your results only by: MyChart Message (if you have MyChart) OR A paper copy in the mail If you have any lab test that is abnormal or we need to change your treatment, we will call you to review the results.  Testing/Procedures: None ordered.  Follow-Up: At [facility], you and your health needs are our priority.  As part of our continuing mission to provide you with exceptional heart care, we have created designated Provider Care Teams.  These Care Teams include your primary Cardiologist (physician) and Advanced Practice Providers (APPs -  Physician Assistants and Nurse Practitioners) who all work together to provide you with the care you need, when you need it.  We recommend signing up for the patient portal called "MyChart".  Sign up information is provided on this After Visit Summary.  MyChart is used to connect with patients for Virtual Visits (Telemedicine).  Patients are able to view lab/test results, encounter notes, upcoming appointments, etc.  Non-urgent messages can be sent to your provider as well.   To learn more about what you can do with MyChart, go to [email or web address].    Your next appointment:   AS NEEDED  The format for your next appointment:   In Person  Provider:   Gregg Taylor, MD{or one of the following Advanced Practice Providers on your designated Care Team:   Renee Ursuy, PA-C Michael "Andy" Tillery, PA-C  Important Information About Sugar        

## 2022-05-17 DIAGNOSIS — I10 Essential (primary) hypertension: Secondary | ICD-10-CM | POA: Diagnosis not present

## 2022-05-17 DIAGNOSIS — R001 Bradycardia, unspecified: Secondary | ICD-10-CM | POA: Diagnosis not present

## 2022-05-17 DIAGNOSIS — M79601 Pain in right arm: Secondary | ICD-10-CM | POA: Diagnosis not present

## 2022-05-17 DIAGNOSIS — E785 Hyperlipidemia, unspecified: Secondary | ICD-10-CM | POA: Diagnosis not present

## 2022-05-24 DIAGNOSIS — M25511 Pain in right shoulder: Secondary | ICD-10-CM | POA: Diagnosis not present

## 2022-05-24 DIAGNOSIS — M19011 Primary osteoarthritis, right shoulder: Secondary | ICD-10-CM | POA: Diagnosis not present

## 2022-05-29 DIAGNOSIS — E785 Hyperlipidemia, unspecified: Secondary | ICD-10-CM | POA: Diagnosis not present

## 2022-05-29 DIAGNOSIS — K2100 Gastro-esophageal reflux disease with esophagitis, without bleeding: Secondary | ICD-10-CM | POA: Diagnosis not present

## 2022-05-29 DIAGNOSIS — M7581 Other shoulder lesions, right shoulder: Secondary | ICD-10-CM | POA: Diagnosis not present

## 2022-06-04 DIAGNOSIS — R001 Bradycardia, unspecified: Secondary | ICD-10-CM | POA: Diagnosis not present

## 2022-06-10 ENCOUNTER — Encounter: Payer: Self-pay | Admitting: Pharmacist

## 2022-06-10 ENCOUNTER — Encounter: Payer: Self-pay | Admitting: Cardiology

## 2022-06-10 ENCOUNTER — Telehealth: Payer: Self-pay | Admitting: Cardiology

## 2022-06-10 NOTE — Telephone Encounter (Signed)
**Note De-Identified Cody Haas Obfuscation** I called [facility] [place] [facility] (Ph: [phone]) and was advised by the pharmacist that the pt picked up a 30 day supply of Nexlizet on 1/27 and that he paid $396.42 for it and that this cost was most likely due to a deductible.  I called the pt and he stated that he does have a deductible but does not know if the amount he paid on the 27th will cover his deductible or not.  He states that Nexlizet is just too expensive even after he meets his deductible.  He states that [place], a pharmacist at this office discussed him switching to an injectable medication that is less expensive for him and he is interested in s/w her about that again.  Per his request I am forwarding this message to [facility].

## 2022-06-10 NOTE — Telephone Encounter (Signed)
Will sign pt up for [employer] grant so that his Nexlizet is free. Pt aware, grant info sent to him via MyChart message.  CARD # [id number]   BIN [id number]   PCN [id number]   GROUP [id number]

## 2022-06-10 NOTE — Telephone Encounter (Signed)
Pt c/o medication issue:  1. Name of Medication:  Bempedoic Acid-Ezetimibe (NEXLIZET) 180-10 MG TABS  2. How are you currently taking this medication (dosage and times per day)?    3. Are you having a reaction (difficulty breathing--STAT)?   4. What is your medication issue?   Patient states this medication increased to about $400/month. He would like to know if he can be put on an alternative. Please advise.

## 2022-08-13 ENCOUNTER — Other Ambulatory Visit: Payer: Self-pay | Admitting: Cardiology

## 2022-08-13 DIAGNOSIS — E785 Hyperlipidemia, unspecified: Secondary | ICD-10-CM

## 2022-09-04 DIAGNOSIS — D0461 Carcinoma in situ of skin of right upper limb, including shoulder: Secondary | ICD-10-CM | POA: Diagnosis not present

## 2022-09-04 DIAGNOSIS — L918 Other hypertrophic disorders of the skin: Secondary | ICD-10-CM | POA: Diagnosis not present

## 2022-09-04 DIAGNOSIS — L570 Actinic keratosis: Secondary | ICD-10-CM | POA: Diagnosis not present

## 2022-09-04 DIAGNOSIS — Z85828 Personal history of other malignant neoplasm of skin: Secondary | ICD-10-CM | POA: Diagnosis not present

## 2022-09-04 DIAGNOSIS — L821 Other seborrheic keratosis: Secondary | ICD-10-CM | POA: Diagnosis not present

## 2022-09-04 DIAGNOSIS — L720 Epidermal cyst: Secondary | ICD-10-CM | POA: Diagnosis not present

## 2022-09-04 DIAGNOSIS — L820 Inflamed seborrheic keratosis: Secondary | ICD-10-CM | POA: Diagnosis not present

## 2022-09-04 DIAGNOSIS — D485 Neoplasm of uncertain behavior of skin: Secondary | ICD-10-CM | POA: Diagnosis not present

## 2022-09-04 DIAGNOSIS — L814 Other melanin hyperpigmentation: Secondary | ICD-10-CM | POA: Diagnosis not present

## 2022-09-04 DIAGNOSIS — D2272 Melanocytic nevi of left lower limb, including hip: Secondary | ICD-10-CM | POA: Diagnosis not present

## 2022-09-13 DIAGNOSIS — Z79899 Other long term (current) drug therapy: Secondary | ICD-10-CM | POA: Diagnosis not present

## 2022-09-13 DIAGNOSIS — G250 Essential tremor: Secondary | ICD-10-CM | POA: Diagnosis not present

## 2022-09-13 DIAGNOSIS — Z0000 Encounter for general adult medical examination without abnormal findings: Secondary | ICD-10-CM | POA: Diagnosis not present

## 2022-09-13 DIAGNOSIS — E7800 Pure hypercholesterolemia, unspecified: Secondary | ICD-10-CM | POA: Diagnosis not present

## 2022-09-13 DIAGNOSIS — I2510 Atherosclerotic heart disease of native coronary artery without angina pectoris: Secondary | ICD-10-CM | POA: Diagnosis not present

## 2022-09-13 DIAGNOSIS — R7301 Impaired fasting glucose: Secondary | ICD-10-CM | POA: Diagnosis not present

## 2022-09-13 DIAGNOSIS — Z8546 Personal history of malignant neoplasm of prostate: Secondary | ICD-10-CM | POA: Diagnosis not present

## 2022-10-17 DIAGNOSIS — L820 Inflamed seborrheic keratosis: Secondary | ICD-10-CM | POA: Diagnosis not present

## 2022-10-17 DIAGNOSIS — L821 Other seborrheic keratosis: Secondary | ICD-10-CM | POA: Diagnosis not present

## 2022-10-17 DIAGNOSIS — D692 Other nonthrombocytopenic purpura: Secondary | ICD-10-CM | POA: Diagnosis not present

## 2022-10-17 DIAGNOSIS — L570 Actinic keratosis: Secondary | ICD-10-CM | POA: Diagnosis not present

## 2022-10-17 DIAGNOSIS — Z85828 Personal history of other malignant neoplasm of skin: Secondary | ICD-10-CM | POA: Diagnosis not present

## 2022-10-17 DIAGNOSIS — S20469A Insect bite (nonvenomous) of unspecified back wall of thorax, initial encounter: Secondary | ICD-10-CM | POA: Diagnosis not present

## 2022-11-29 DIAGNOSIS — R55 Syncope and collapse: Secondary | ICD-10-CM | POA: Diagnosis not present

## 2022-11-29 DIAGNOSIS — I10 Essential (primary) hypertension: Secondary | ICD-10-CM | POA: Diagnosis not present

## 2022-11-29 DIAGNOSIS — I209 Angina pectoris, unspecified: Secondary | ICD-10-CM | POA: Diagnosis not present

## 2022-11-29 DIAGNOSIS — E785 Hyperlipidemia, unspecified: Secondary | ICD-10-CM | POA: Diagnosis not present

## 2022-12-10 DIAGNOSIS — I209 Angina pectoris, unspecified: Secondary | ICD-10-CM | POA: Diagnosis not present

## 2022-12-10 DIAGNOSIS — I2089 Other forms of angina pectoris: Secondary | ICD-10-CM | POA: Diagnosis not present

## 2022-12-16 DIAGNOSIS — I25110 Atherosclerotic heart disease of native coronary artery with unstable angina pectoris: Secondary | ICD-10-CM | POA: Diagnosis not present

## 2022-12-16 DIAGNOSIS — I209 Angina pectoris, unspecified: Secondary | ICD-10-CM | POA: Diagnosis not present

## 2023-01-01 DIAGNOSIS — H524 Presbyopia: Secondary | ICD-10-CM | POA: Diagnosis not present

## 2023-01-01 DIAGNOSIS — H35371 Puckering of macula, right eye: Secondary | ICD-10-CM | POA: Diagnosis not present

## 2023-01-01 DIAGNOSIS — Z961 Presence of intraocular lens: Secondary | ICD-10-CM | POA: Diagnosis not present

## 2023-01-23 ENCOUNTER — Ambulatory Visit
Admission: RE | Admit: 2023-01-23 | Discharge: 2023-01-23 | Disposition: A | Discharge status: Home / Self Care | Payer: Medicare Other | Source: Ambulatory Visit | Attending: Family Medicine | Admitting: Family Medicine

## 2023-01-23 ENCOUNTER — Other Ambulatory Visit: Payer: Self-pay | Admitting: Family Medicine

## 2023-01-23 DIAGNOSIS — R109 Unspecified abdominal pain: Secondary | ICD-10-CM

## 2023-01-23 DIAGNOSIS — N200 Calculus of kidney: Secondary | ICD-10-CM | POA: Diagnosis not present

## 2023-01-23 DIAGNOSIS — Z23 Encounter for immunization: Secondary | ICD-10-CM | POA: Diagnosis not present

## 2023-03-06 DIAGNOSIS — L918 Other hypertrophic disorders of the skin: Secondary | ICD-10-CM | POA: Diagnosis not present

## 2023-03-06 DIAGNOSIS — D225 Melanocytic nevi of trunk: Secondary | ICD-10-CM | POA: Diagnosis not present

## 2023-03-06 DIAGNOSIS — L570 Actinic keratosis: Secondary | ICD-10-CM | POA: Diagnosis not present

## 2023-03-06 DIAGNOSIS — L821 Other seborrheic keratosis: Secondary | ICD-10-CM | POA: Diagnosis not present

## 2023-03-06 DIAGNOSIS — L814 Other melanin hyperpigmentation: Secondary | ICD-10-CM | POA: Diagnosis not present

## 2023-03-06 DIAGNOSIS — D1801 Hemangioma of skin and subcutaneous tissue: Secondary | ICD-10-CM | POA: Diagnosis not present

## 2023-03-06 DIAGNOSIS — Z85828 Personal history of other malignant neoplasm of skin: Secondary | ICD-10-CM | POA: Diagnosis not present

## 2023-04-14 ENCOUNTER — Telehealth: Payer: Self-pay | Admitting: Cardiology

## 2023-04-14 NOTE — Telephone Encounter (Signed)
Renewed Cody Haas for another year  ID     [id number] BIN         [id number] PCN   [id number] GRP   [id number]  Expires 05/10/2024

## 2023-04-14 NOTE — Telephone Encounter (Signed)
Pt c/o medication issue:  1. Name of Medication:   Bempedoic Acid-Ezetimibe (NEXLIZET) 180-10 MG TABS   2. How are you currently taking this medication (dosage and times per day)?   3. Are you having a reaction (difficulty breathing--STAT)?   4. What is your medication issue?   Patient stated he wants to re-apply for grant to get this medication and reduced cost.

## 2023-04-18 ENCOUNTER — Other Ambulatory Visit: Payer: Self-pay | Admitting: Cardiology

## 2023-05-16 ENCOUNTER — Other Ambulatory Visit: Payer: Self-pay | Admitting: Cardiology

## 2023-05-16 DIAGNOSIS — E785 Hyperlipidemia, unspecified: Secondary | ICD-10-CM

## 2023-07-04 ENCOUNTER — Other Ambulatory Visit: Payer: Self-pay | Admitting: Cardiology

## 2023-07-04 DIAGNOSIS — E785 Hyperlipidemia, unspecified: Secondary | ICD-10-CM

## 2023-07-07 ENCOUNTER — Telehealth: Payer: Self-pay | Admitting: Pharmacist

## 2023-07-07 MED ORDER — ROSUVASTATIN CALCIUM 10 MG PO TABS: 10.0000 mg | ORAL_TABLET | Freq: Every day | ORAL | 0 refills | Status: DC

## 2023-07-07 NOTE — Telephone Encounter (Signed)
 Patient called requesting more refills on Crestor 10 mg prescription. He is in [place]. Will be back April. Last refill was only for 15 days as he is overdue to see Dr.Skains. however 15 days supply will not last him till April so sent prescription for 45 days.

## 2023-08-09 ENCOUNTER — Other Ambulatory Visit: Payer: Self-pay | Admitting: Cardiology

## 2023-08-13 ENCOUNTER — Telehealth: Payer: Self-pay | Admitting: Cardiology

## 2023-08-13 MED ORDER — NEXLIZET 180-10 MG PO TABS: 1.0000 | ORAL_TABLET | Freq: Every day | ORAL | 1 refills | Status: DC

## 2023-08-13 NOTE — Telephone Encounter (Signed)
 Spoke with patient and he is aware new health well information has been sent to pharmacy

## 2023-08-13 NOTE — Telephone Encounter (Signed)
 Spoke with patient and he states he thought he had health well grant for his Nexlizet.   I do not see recent encounter. Ladies can one of you clarify ?

## 2023-08-13 NOTE — Telephone Encounter (Signed)
 Pt is requesting cb to discuss issue at pharmacy with med. Advised $202 for 15 pills which he cannot afford

## 2023-08-13 NOTE — Telephone Encounter (Signed)
*  STAT* If patient is at the pharmacy, call can be transferred to refill team.   1. Which medications need to be refilled? (please list name of each medication and dose if known)   Bempedoic Acid-Ezetimibe (NEXLIZET) 180-10 MG TABS     2. Would you like to learn more about the convenience, safety, & potential cost savings by using the [facility]? No   3. Are you open to using the [facility] (Type [facility]. ). No   4. Which pharmacy/location (including street and city if local pharmacy) is medication to be sent to? [id number] - [place], [place] - [address] AT [address]    5. Do they need a 30 day or 90 day supply? 90 day   Pt has upcoming appt 10/13/23

## 2023-08-13 NOTE — Telephone Encounter (Signed)
 Pt's medication was sent to pt's pharmacy as requested. Confirmation received.

## 2023-09-04 DIAGNOSIS — M25561 Pain in right knee: Secondary | ICD-10-CM | POA: Diagnosis not present

## 2023-09-04 DIAGNOSIS — M542 Cervicalgia: Secondary | ICD-10-CM | POA: Diagnosis not present

## 2023-09-05 DIAGNOSIS — M542 Cervicalgia: Secondary | ICD-10-CM | POA: Diagnosis not present

## 2023-09-08 ENCOUNTER — Other Ambulatory Visit: Payer: Self-pay | Admitting: Cardiology

## 2023-09-08 DIAGNOSIS — L218 Other seborrheic dermatitis: Secondary | ICD-10-CM | POA: Diagnosis not present

## 2023-09-08 DIAGNOSIS — D485 Neoplasm of uncertain behavior of skin: Secondary | ICD-10-CM | POA: Diagnosis not present

## 2023-09-08 DIAGNOSIS — L853 Xerosis cutis: Secondary | ICD-10-CM | POA: Diagnosis not present

## 2023-09-08 DIAGNOSIS — Z85828 Personal history of other malignant neoplasm of skin: Secondary | ICD-10-CM | POA: Diagnosis not present

## 2023-09-08 DIAGNOSIS — L821 Other seborrheic keratosis: Secondary | ICD-10-CM | POA: Diagnosis not present

## 2023-09-08 DIAGNOSIS — S20461A Insect bite (nonvenomous) of right back wall of thorax, initial encounter: Secondary | ICD-10-CM | POA: Diagnosis not present

## 2023-09-08 DIAGNOSIS — D1801 Hemangioma of skin and subcutaneous tissue: Secondary | ICD-10-CM | POA: Diagnosis not present

## 2023-09-08 DIAGNOSIS — D692 Other nonthrombocytopenic purpura: Secondary | ICD-10-CM | POA: Diagnosis not present

## 2023-09-08 DIAGNOSIS — C4441 Basal cell carcinoma of skin of scalp and neck: Secondary | ICD-10-CM | POA: Diagnosis not present

## 2023-09-08 DIAGNOSIS — L570 Actinic keratosis: Secondary | ICD-10-CM | POA: Diagnosis not present

## 2023-09-08 DIAGNOSIS — L814 Other melanin hyperpigmentation: Secondary | ICD-10-CM | POA: Diagnosis not present

## 2023-09-08 DIAGNOSIS — S40861A Insect bite (nonvenomous) of right upper arm, initial encounter: Secondary | ICD-10-CM | POA: Diagnosis not present

## 2023-09-23 DIAGNOSIS — L718 Other rosacea: Secondary | ICD-10-CM | POA: Diagnosis not present

## 2023-09-23 DIAGNOSIS — C44519 Basal cell carcinoma of skin of other part of trunk: Secondary | ICD-10-CM | POA: Diagnosis not present

## 2023-09-26 DIAGNOSIS — M542 Cervicalgia: Secondary | ICD-10-CM | POA: Diagnosis not present

## 2023-09-26 DIAGNOSIS — M5412 Radiculopathy, cervical region: Secondary | ICD-10-CM | POA: Diagnosis not present

## 2023-09-30 NOTE — Progress Notes (Signed)
 Cardiology Office Note:  .   Date:  10/13/2023  ID:  Cody Haas, DOB November 24, 1945, MRN [id number] PCP: Vann Gent, MD  [facility] Providers Cardiologist:  Dorothye Gathers, MD    History of Present Illness: .   Cody Haas is a 78 y.o. male  with history of severe coronary artery calcification with nonobstructive coronary artery disease on cardiac catheterization 01/2022, difficult to control hyperlipidemia, dizziness shortness of breath.    Bradycardia with 4.5 second pause on cardiac monitor off beta blocker and asymptomatic. Was seen by Dr. Carolynne Citron who recommended watchful waiting.  Patient comes in for f/u. He brought his labs from [place] done 10/01/23 and LDL 47. Bruising a lot from ASA. He complains of fatigue. Walks his dog 3 times a day for 1-1 1/2 miles doing hills. He has hip pain. Also some lower back pain. His Cardiologist in the [place], Dr. Sampson Critchley did a monitor back in Aug and it was ok. Will try to get records. Denies chest pain or dyspnea. He does a lot of free diving, snorkeling, fishing. Lives in [place]  6 months of the year.  ROS:    Studies Reviewed: Cody Haas    EKG Interpretation Date/Time:  Monday October 13 2023 09:54:59 EDT Ventricular Rate:  68 PR Interval:  164 QRS Duration:  92 QT Interval:  402 QTC Calculation: 427 R Axis:   -15  Text Interpretation: Sinus rhythm with marked sinus arrhythmia last EKG NSR Confirmed by Theotis Flake [phone] on 10/13/2023 9:58:44 AM    Prior CV Studies:   Left heart cath and coronary angiography: 02/01/22     Prox RCA lesion is 20% stenosed.   Dist RCA lesion is 20% stenosed.   RPAV lesion is 20% stenosed.   Ost Cx to Prox Cx lesion is 20% stenosed.   Prox LAD to Mid LAD lesion is 50% stenosed.   Ost LAD to Prox LAD lesion is 30% stenosed.   Mid LAD to Dist LAD lesion is 20% stenosed.   1st Diag lesion is 50% stenosed.   Mild to moderate non-obstructive disease in the proximal and mid LAD Moderate non-obstructive  disease in the large caliber Diagonal branch Large Circumflex artery with mild non-obstructive disease Large, dominant RCA with mild diffuse non-obstructive disease.  Normal LV filling pressures   Recommendations: Medical management of non-obstructive CAD   Coronary CT 10/14/19   FINDINGS: FFRct analysis was performed on the original cardiac CT angiogram dataset. Diagrammatic representation of the FFRct analysis is provided in a separate PDF document in PACS. This dictation was created using the PDF document and an interactive 3D model of the results. 3D model is not available in the EMR/PACS. Normal FFR range is >0.80.   LM: 0.98   LAD: 0.97, 0.96, 0.89   Circ: 0.97, 0.97, 0.91   RCA: 0.96, 0.96, 0.86 (PDA)   IMPRESSION: 1. Large caliber vessels with no flow limiting CAD despite large calcium  burden.   2.  Recommend aggressive medical management.     Risk Assessment/Calculations:             Physical Exam:   VS:  BP 120/86 (BP Location: Left Arm, Patient Position: Sitting, Cuff Size: Normal)   Pulse 68   Ht 5\' 11"  (1.803 m)   Wt 198 lb 3.2 oz (89.9 kg)   SpO2 95%   BMI 27.64 kg/m    Wt Readings from Last 3 Encounters:  10/13/23 198 lb 3.2 oz (89.9 kg)  04/16/22 202 lb (91.6  kg)  03/07/22 201 lb (91.2 kg)    GEN: Well nourished, well developed in no acute distress NECK: No JVD; No carotid bruits CARDIAC:  RRR, no murmurs, rubs, gallops RESPIRATORY:  Clear to auscultation without rales, wheezing or rhonchi  ABDOMEN: Soft, non-tender, non-distended EXTREMITIES:  No edema; No deformity   ASSESSMENT AND PLAN: .      Coronary artery disease-nonobstructive on cath 2023  Bradycardia with 4.5 second pause on monitor asymptomatic-watchful waiting. Today's EKG NSR with atrial arrhythmia.   Hyperlipidemia --CT 2021 - Ca score 2000's but FFR with no flow limiting disease  --LDL 47 on meds as above taking Nexlizet ,,Crestor  10.  Excellent.   Hypertension - Prior  orthostatics  hypotension and amlodipine  stopped              Dispo: f/u Dr. Renna Cary in 1 yr.  Signed, Theotis Flake, PA-C

## 2023-10-01 DIAGNOSIS — R7301 Impaired fasting glucose: Secondary | ICD-10-CM | POA: Diagnosis not present

## 2023-10-01 DIAGNOSIS — G250 Essential tremor: Secondary | ICD-10-CM | POA: Diagnosis not present

## 2023-10-01 DIAGNOSIS — R5383 Other fatigue: Secondary | ICD-10-CM | POA: Diagnosis not present

## 2023-10-01 DIAGNOSIS — Z8546 Personal history of malignant neoplasm of prostate: Secondary | ICD-10-CM | POA: Diagnosis not present

## 2023-10-01 DIAGNOSIS — Z79899 Other long term (current) drug therapy: Secondary | ICD-10-CM | POA: Diagnosis not present

## 2023-10-01 DIAGNOSIS — E7800 Pure hypercholesterolemia, unspecified: Secondary | ICD-10-CM | POA: Diagnosis not present

## 2023-10-01 DIAGNOSIS — I2510 Atherosclerotic heart disease of native coronary artery without angina pectoris: Secondary | ICD-10-CM | POA: Diagnosis not present

## 2023-10-01 DIAGNOSIS — Z0000 Encounter for general adult medical examination without abnormal findings: Secondary | ICD-10-CM | POA: Diagnosis not present

## 2023-10-01 LAB — LAB REPORT - SCANNED
A1c: 6.2
EGFR: 69
TSH: 3.03 (ref 0.41–5.90)

## 2023-10-02 DIAGNOSIS — M542 Cervicalgia: Secondary | ICD-10-CM | POA: Diagnosis not present

## 2023-10-02 DIAGNOSIS — M5412 Radiculopathy, cervical region: Secondary | ICD-10-CM | POA: Diagnosis not present

## 2023-10-09 ENCOUNTER — Other Ambulatory Visit: Payer: Self-pay | Admitting: Cardiology

## 2023-10-13 ENCOUNTER — Other Ambulatory Visit (HOSPITAL_COMMUNITY): Payer: Self-pay

## 2023-10-13 ENCOUNTER — Telehealth: Payer: Self-pay | Admitting: Pharmacy Technician

## 2023-10-13 ENCOUNTER — Encounter: Payer: Self-pay | Admitting: Physician Assistant

## 2023-10-13 ENCOUNTER — Encounter: Payer: Self-pay | Admitting: Pharmacy Technician

## 2023-10-13 ENCOUNTER — Ambulatory Visit: Attending: Physician Assistant | Admitting: Physician Assistant

## 2023-10-13 VITALS — BP 120/86 | HR 68 | Ht 71.0 in | Wt 198.2 lb

## 2023-10-13 DIAGNOSIS — R001 Bradycardia, unspecified: Secondary | ICD-10-CM | POA: Diagnosis not present

## 2023-10-13 DIAGNOSIS — E785 Hyperlipidemia, unspecified: Secondary | ICD-10-CM

## 2023-10-13 DIAGNOSIS — I2510 Atherosclerotic heart disease of native coronary artery without angina pectoris: Secondary | ICD-10-CM | POA: Diagnosis not present

## 2023-10-13 DIAGNOSIS — I10 Essential (primary) hypertension: Secondary | ICD-10-CM | POA: Diagnosis not present

## 2023-10-13 MED ORDER — NEXLIZET 180-10 MG PO TABS: 1.0000 | ORAL_TABLET | Freq: Every day | ORAL | 3 refills | Status: AC

## 2023-10-13 MED ORDER — ROSUVASTATIN CALCIUM 10 MG PO TABS: 10.0000 mg | ORAL_TABLET | Freq: Every day | ORAL | 3 refills | Status: AC

## 2023-10-13 NOTE — Patient Instructions (Signed)
 Medication Instructions:  Your physician recommends that you continue on your current medications as directed. Please refer to the Current Medication list given to you today.  *If you need a refill on your cardiac medications before your next appointment, please call your pharmacy*  Lab Work: NONE If you have labs (blood work) drawn today and your tests are completely normal, you will receive your results only by: MyChart Message (if you have MyChart) OR A paper copy in the mail If you have any lab test that is abnormal or we need to change your treatment, we will call you to review the results.  Testing/Procedures: NONE  Follow-Up: At [facility], you and your health needs are our priority.  As part of our continuing mission to provide you with exceptional heart care, our providers are all part of one team.  This team includes your primary Cardiologist (physician) and Advanced Practice Providers or APPs (Physician Assistants and Nurse Practitioners) who all work together to provide you with the care you need, when you need it.  Your next appointment:   1 year(s)  Provider:   Dorothye Gathers, MD   We recommend signing up for the patient portal called "MyChart".  Sign up information is provided on this After Visit Summary.  MyChart is used to connect with patients for Virtual Visits (Telemedicine).  Patients are able to view lab/test results, encounter notes, upcoming appointments, etc.  Non-urgent messages can be sent to your provider as well.   To learn more about what you can do with MyChart, go to [email or web address].

## 2023-10-13 NOTE — Addendum Note (Signed)
 Addended by: Gayleen Kawasaki D on: 10/13/2023 10:53 AM   Modules accepted: Orders

## 2023-10-13 NOTE — Telephone Encounter (Signed)
 Pt's medication was sent to pt's pharmacy as requested. Confirmation received.

## 2023-10-13 NOTE — Telephone Encounter (Signed)
 Hello, can someone send a refill for nexlizet  to his pharmacy: thank you!     Provider came and asked to provide assistance for this patient for nexlizet . Ran test claim and went through just fine. The patient also has a healthwell grant per another encounter. I called [facility] that he last filled it at and asked them to fill the nexlizet  on the grant. They said they only have 15 tablets left on the rx. Sent message to staff and patient  Healthwell ID     [id number] BIN         [id number] PCN   [id number] GRP   [id number] Expires 05/10/2024

## 2023-10-15 DIAGNOSIS — M542 Cervicalgia: Secondary | ICD-10-CM | POA: Diagnosis not present

## 2023-10-15 DIAGNOSIS — M5412 Radiculopathy, cervical region: Secondary | ICD-10-CM | POA: Diagnosis not present

## 2023-10-22 DIAGNOSIS — M542 Cervicalgia: Secondary | ICD-10-CM | POA: Diagnosis not present

## 2023-10-22 DIAGNOSIS — M5412 Radiculopathy, cervical region: Secondary | ICD-10-CM | POA: Diagnosis not present

## 2024-01-06 DIAGNOSIS — Z961 Presence of intraocular lens: Secondary | ICD-10-CM | POA: Diagnosis not present

## 2024-01-06 DIAGNOSIS — H35371 Puckering of macula, right eye: Secondary | ICD-10-CM | POA: Diagnosis not present

## 2024-01-06 DIAGNOSIS — H524 Presbyopia: Secondary | ICD-10-CM | POA: Diagnosis not present

## 2024-02-12 DIAGNOSIS — Z23 Encounter for immunization: Secondary | ICD-10-CM | POA: Diagnosis not present

## 2024-02-24 DIAGNOSIS — M549 Dorsalgia, unspecified: Secondary | ICD-10-CM | POA: Diagnosis not present

## 2024-02-24 DIAGNOSIS — Z23 Encounter for immunization: Secondary | ICD-10-CM | POA: Diagnosis not present

## 2024-02-24 DIAGNOSIS — K5900 Constipation, unspecified: Secondary | ICD-10-CM | POA: Diagnosis not present

## 2024-03-31 DIAGNOSIS — D1801 Hemangioma of skin and subcutaneous tissue: Secondary | ICD-10-CM | POA: Diagnosis not present

## 2024-03-31 DIAGNOSIS — D225 Melanocytic nevi of trunk: Secondary | ICD-10-CM | POA: Diagnosis not present

## 2024-03-31 DIAGNOSIS — L814 Other melanin hyperpigmentation: Secondary | ICD-10-CM | POA: Diagnosis not present

## 2024-03-31 DIAGNOSIS — L718 Other rosacea: Secondary | ICD-10-CM | POA: Diagnosis not present

## 2024-03-31 DIAGNOSIS — L853 Xerosis cutis: Secondary | ICD-10-CM | POA: Diagnosis not present

## 2024-03-31 DIAGNOSIS — L570 Actinic keratosis: Secondary | ICD-10-CM | POA: Diagnosis not present

## 2024-03-31 DIAGNOSIS — L218 Other seborrheic dermatitis: Secondary | ICD-10-CM | POA: Diagnosis not present

## 2024-03-31 DIAGNOSIS — Z85828 Personal history of other malignant neoplasm of skin: Secondary | ICD-10-CM | POA: Diagnosis not present

## 2024-03-31 DIAGNOSIS — L821 Other seborrheic keratosis: Secondary | ICD-10-CM | POA: Diagnosis not present

## 2024-06-16 ENCOUNTER — Telehealth: Payer: Self-pay | Admitting: Cardiology

## 2024-06-16 NOTE — Telephone Encounter (Signed)
 Pt c/o medication issue:  1. Name of Medication: Bempedoic Acid-Ezetimibe  (NEXLIZET ) 180-10 MG TABS   2. How are you currently taking this medication (dosage and times per day)? As written   3. Are you having a reaction (difficulty breathing--STAT)? No   4. What is your medication issue? Pt states he would like to reapply for a grant to reduce the cost.
